# Patient Record
Sex: Female | Born: 1985 | Race: White | Hispanic: No | Marital: Married | Smoking: Never smoker
Health system: Southern US, Community
[De-identification: ages and names within clinical notes are randomized; demographics above are authoritative.]

## PROBLEM LIST (undated history)

## (undated) ENCOUNTER — Inpatient Hospital Stay (HOSPITAL_COMMUNITY): Payer: Self-pay

## (undated) DIAGNOSIS — B999 Unspecified infectious disease: Secondary | ICD-10-CM

## (undated) HISTORY — PX: RHINOPLASTY: SUR1284

## (undated) SURGERY — Surgical Case
Anesthesia: Spinal

---

## 2013-12-21 ENCOUNTER — Observation Stay: Payer: Self-pay | Admitting: Obstetrics and Gynecology

## 2013-12-22 LAB — URINALYSIS, COMPLETE
Bacteria: NONE SEEN
Bilirubin,UR: NEGATIVE
GLUCOSE, UR: NEGATIVE mg/dL (ref 0–75)
Ketone: NEGATIVE
Nitrite: NEGATIVE
Ph: 7 (ref 4.5–8.0)
Protein: NEGATIVE
Specific Gravity: 1.003 (ref 1.003–1.030)
Squamous Epithelial: <1
WBC UR: 1 /HPF (ref 0–5)

## 2013-12-22 LAB — WET PREP, GENITAL

## 2014-03-04 ENCOUNTER — Inpatient Hospital Stay: Payer: Self-pay

## 2014-03-04 LAB — CBC WITH DIFFERENTIAL/PLATELET
Basophil #: 0.1 10*3/uL (ref 0.0–0.1)
Basophil %: 0.5 %
Eosinophil #: 0.1 10*3/uL (ref 0.0–0.7)
Eosinophil %: 0.8 %
HCT: 36.4 % (ref 35.0–47.0)
HGB: 11.3 g/dL — ABNORMAL LOW (ref 12.0–16.0)
Lymphocyte #: 1.6 10*3/uL (ref 1.0–3.6)
Lymphocyte %: 14.8 %
MCH: 24.7 pg — AB (ref 26.0–34.0)
MCHC: 30.9 g/dL — ABNORMAL LOW (ref 32.0–36.0)
MCV: 80 fL (ref 80–100)
MONOS PCT: 7.4 %
Monocyte #: 0.8 x10 3/mm (ref 0.2–0.9)
Neutrophil #: 8.5 10*3/uL — ABNORMAL HIGH (ref 1.4–6.5)
Neutrophil %: 76.5 %
Platelet: 324 10*3/uL (ref 150–440)
RBC: 4.56 10*6/uL (ref 3.80–5.20)
RDW: 14.2 % (ref 11.5–14.5)
WBC: 11.1 10*3/uL — ABNORMAL HIGH (ref 3.6–11.0)

## 2014-03-06 DIAGNOSIS — O4290 Premature rupture of membranes, unspecified as to length of time between rupture and onset of labor, unspecified weeks of gestation: Secondary | ICD-10-CM

## 2014-03-06 LAB — CBC WITH DIFFERENTIAL/PLATELET
BASOS ABS: 0.1 10*3/uL (ref 0.0–0.1)
BASOS PCT: 0.5 %
Eosinophil #: 0.1 10*3/uL (ref 0.0–0.7)
Eosinophil %: 0.6 %
HCT: 37.2 % (ref 35.0–47.0)
HGB: 11.8 g/dL — ABNORMAL LOW (ref 12.0–16.0)
LYMPHS ABS: 1.6 10*3/uL (ref 1.0–3.6)
LYMPHS PCT: 14 %
MCH: 25.3 pg — AB (ref 26.0–34.0)
MCHC: 31.7 g/dL — ABNORMAL LOW (ref 32.0–36.0)
MCV: 80 fL (ref 80–100)
Monocyte #: 0.9 x10 3/mm (ref 0.2–0.9)
Monocyte %: 7.9 %
NEUTROS PCT: 77 %
Neutrophil #: 8.9 10*3/uL — ABNORMAL HIGH (ref 1.4–6.5)
PLATELETS: 315 10*3/uL (ref 150–440)
RBC: 4.65 10*6/uL (ref 3.80–5.20)
RDW: 14.6 % — ABNORMAL HIGH (ref 11.5–14.5)
WBC: 11.6 10*3/uL — ABNORMAL HIGH (ref 3.6–11.0)

## 2014-03-07 LAB — HEMATOCRIT: HCT: 27.8 % — AB (ref 35.0–47.0)

## 2014-03-09 LAB — CBC WITH DIFFERENTIAL/PLATELET
BASOS ABS: 0 10*3/uL (ref 0.0–0.1)
BASOS PCT: 0.4 %
EOS ABS: 0.3 10*3/uL (ref 0.0–0.7)
EOS PCT: 3.1 %
HCT: 27.9 % — ABNORMAL LOW (ref 35.0–47.0)
HGB: 8.6 g/dL — ABNORMAL LOW (ref 12.0–16.0)
LYMPHS PCT: 15.8 %
Lymphocyte #: 1.6 10*3/uL (ref 1.0–3.6)
MCH: 24.9 pg — ABNORMAL LOW (ref 26.0–34.0)
MCHC: 30.6 g/dL — AB (ref 32.0–36.0)
MCV: 81 fL (ref 80–100)
MONO ABS: 0.7 x10 3/mm (ref 0.2–0.9)
MONOS PCT: 6.6 %
NEUTROS ABS: 7.6 10*3/uL — AB (ref 1.4–6.5)
Neutrophil %: 74.1 %
Platelet: 294 10*3/uL (ref 150–440)
RBC: 3.44 10*6/uL — AB (ref 3.80–5.20)
RDW: 14.9 % — ABNORMAL HIGH (ref 11.5–14.5)
WBC: 10.3 10*3/uL (ref 3.6–11.0)

## 2014-11-10 NOTE — Op Note (Signed)
PATIENT NAME:  [name] Mcintyre, [name] MR#:  [id number] DATE OF BIRTH:  08-Dec-1985  DATE OF PROCEDURE:  03/06/2014  PREOPERATIVE DIAGNOSES:  1. Premature rupture of mebranes at 37 weeks 3 days gestation 2. Fetal intolerance to labor.   POSTOPERATIVE DIAGNOSES:  1. Premature rupture of membranes at 37 weeks 3 days gestation 2. Fetal intolerance to labor.   PROCEDURE PERFORMED: Primary low transverse cesarean section via Pfannenstiel skin incision.   ANESTHESIA USED: Epidural.   PRIMARY SURGEON: Florina Oundreas M. Bonney AidStaebler, M.D.   ESTIMATED BLOOD LOSS: 700 mL.   IV FLUIDS: 1 liter of crystalloid.   URINE OUTPUT: 700 mL.  PREOPERATIVE ANTIBIOTICS: 2 grams Ancef.   DRAINS OR TUBES:  Foley to gravity drainage, On-Q catheter system.   IMPLANTS: None.   THE PATIENT CONDITION FOLLOWING PROCEDURE: Stable.   COMPLICATIONS: None.   SPECIMENS REMOVED: None.   INTRAOPERATIVE FINDINGS: Normal tubes, ovaries, and uterus. Fetus in the OA position. Delivery resulted in the birth of a liveborn female infant weighing 3,060 grams, 6 pounds 12 ounces, Apgars 8 and 9.   PROCEDURE IN DETAIL: Risks, benefits, and alternatives of the procedure were discussed with the patient prior proceeding to the operating room. The patient was taken to the operating room where she was placed under general endotracheal anesthesia. She was positioned in the supine position, prepped and draped in the usual sterile fashion. Timeout was performed. The level of anesthetic was checked and noted to be adequate prior to proceeding with the procedure. A Pfannenstiel skin incision was made using the scalpel 2 cm above the pubic symphysis, carried down sharply to the level of the rectus fascia. The fascia was incised in the midline using the scalpel. The fascial incision was then extended using Mayo scissors. The superior border of the rectus fascia was grasped with two Kocher clamps and the rectus muscles were dissected off the fascia bluntly and  the median raphe incised using Mayo scissors. The inferior border of the rectus fascia was dissected off the rectus muscles in a similar fashion. The midline was identified. The peritoneum was entered bluntly and the peritoneal incision extended using manual traction. A bladder blade was placed, displacing the bladder caudally. A low transverse incision was then scored on the uterus using the scalpel. The uterus was entered bluntly using the operator's finger. The hysterotomy incision was extended using manual traction. Amniotomy was performed using an Allis clamp noting clear fluid. The operator's finger was placed in the hysterotomy incision noting the infant to be in the OA position. The vertex was grasped, flexed, brought to the incision, delivered atraumatically using fundal pressure. The remainder of the body delivered with ease. The infant was suctioned, cord was clamped and cut and the infant was passed to the awaiting pediatrician. Following delivery of the infant, the placenta was delivered using manual extraction. The uterus was exteriorized and wiped clean of clots and debris using two moist laps. The uterine incision was then closed using a 2 layer closure of 0 Vicryl with the first being a running locked, the second a vertical imbricating. The uterus was returned to the abdomen, perineum gutters were wiped clean of clots and debris. The hysterotomy incision was reinspected and noted to be hemostatic. The peritoneum was then closed using 2-0 Vicryl in a running fashion. The rectus muscles were reapproximated using a single 2-0 Vicryl mattress stitch. The On-Q catheters were placed subfascially per the usual protocol. Fascia was closed using a looped #1 PDS in a running fashion. Subcutaneous tissue  was irrigated. Hemostasis was achieved using the Bovie. The skin was closed using 4 Monocryl in a subcuticular fashion. The On-Q catheters were bolused with 5 mL of 0.5% bupivacaine each. Sponge, needle and  instrument counts were correct x 2. The patient tolerated the procedure well and was taken to the recovery room in stable condition.    ____________________________ Florina Ou. Bonney Aid, MD ams:JT D: 03/09/2014 23:29:40 ET T: 03/10/2014 03:56:53 ET JOB#: [id number]  cc: Florina Ou. Bonney Aid, MD, <Dictator> [place] Cathrine Muster MD ELECTRONICALLY SIGNED 03/12/2014 15:55

## 2014-11-27 NOTE — H&P (Signed)
L&D Evaluation:  History Expanded:  HPI 29 yo G1 with EDD of 03/18/14 per LMP and 7 wk US. Presents at 38 weeks with c/o large gush of clear fluid around 10 this am. She reports some contractions since then. [facility] to care, BMI >30 - early 1 hr 96. Received TDAP at 30 weeks.   Blood Type (Maternal) B positive   Group B Strep Results Maternal (Result >5wks must be treated as unknown) positive   Maternal HIV Negative   Maternal Syphilis Ab Nonreactive   Maternal Varicella Immune   Rubella Results (Maternal) immune   Maternal T-Dap Immune   Presents with leaking fluid   Patient's Medical History No Chronic Illness   Patient's Surgical History none   Medications Pre Natal Vitamins   Allergies NKDA   Social History none   Family History Non-Contributory   Exam:  Vital Signs stable   General no apparent distress   Mental Status clear   Chest clear   Heart no murmur/gallop/rubs   Abdomen gravid, tender with contractions   Estimated Fetal Weight Average for gestational age, 7 lb   Pelvic no external lesions, RN unable to reach cervix   Mebranes Ruptured   Description clear   FHT normal rate with no decels, category 1 tracing   Ucx regular, q 3-5 min   Impression:  Impression SROM, early labor   Plan:  Plan EFM/NST, monitor contractions and for cervical change, antibiotics for GBBS prophylaxis   Comments Ambulate if desired. Will re-exam after antibiotics in x 4 hours.  Pitocin augmentation prn.   Electronic Signatures: Vella KohlerBrothers, Kaliegh Willadsen K (CNM)  (Signed 16-Aug-15 14:46)  Authored: L&D Evaluation   Last Updated: 16-Aug-15 14:46 by Vella KohlerBrothers, Adolf Ormiston K (CNM)

## 2015-02-04 ENCOUNTER — Inpatient Hospital Stay (HOSPITAL_COMMUNITY)
Admission: AD | Admit: 2015-02-04 | Discharge: 2015-02-04 | Disposition: A | Discharge status: Home / Self Care | Payer: 59 | Source: Ambulatory Visit | Attending: Obstetrics & Gynecology | Admitting: Obstetrics & Gynecology

## 2015-02-04 ENCOUNTER — Inpatient Hospital Stay (HOSPITAL_COMMUNITY): Payer: 59

## 2015-02-04 ENCOUNTER — Encounter (HOSPITAL_COMMUNITY): Payer: Self-pay | Admitting: *Deleted

## 2015-02-04 DIAGNOSIS — O200 Threatened abortion: Secondary | ICD-10-CM | POA: Insufficient documentation

## 2015-02-04 DIAGNOSIS — R109 Unspecified abdominal pain: Secondary | ICD-10-CM | POA: Diagnosis present

## 2015-02-04 DIAGNOSIS — O209 Hemorrhage in early pregnancy, unspecified: Secondary | ICD-10-CM

## 2015-02-04 DIAGNOSIS — Z3A11 11 weeks gestation of pregnancy: Secondary | ICD-10-CM | POA: Diagnosis not present

## 2015-02-04 HISTORY — DX: Unspecified infectious disease: B99.9

## 2015-02-04 LAB — CBC
HCT: 37.9 % (ref 36.0–46.0)
Hemoglobin: 12 g/dL (ref 12.0–15.0)
MCH: 26.1 pg (ref 26.0–34.0)
MCHC: 31.7 g/dL (ref 30.0–36.0)
MCV: 82.6 fL (ref 78.0–100.0)
Platelets: 346 10*3/uL (ref 150–400)
RBC: 4.59 MIL/uL (ref 3.87–5.11)
RDW: 14.4 % (ref 11.5–15.5)
WBC: 11.3 10*3/uL — ABNORMAL HIGH (ref 4.0–10.5)

## 2015-02-04 LAB — URINALYSIS, ROUTINE W REFLEX MICROSCOPIC
Bilirubin Urine: NEGATIVE
Glucose, UA: NEGATIVE mg/dL
KETONES UR: NEGATIVE mg/dL
NITRITE: NEGATIVE
Protein, ur: 100 mg/dL — AB
Urobilinogen, UA: 0.2 mg/dL (ref 0.0–1.0)
pH: 7 (ref 5.0–8.0)

## 2015-02-04 LAB — URINE MICROSCOPIC-ADD ON

## 2015-02-04 LAB — HCG, QUANTITATIVE, PREGNANCY: HCG, BETA CHAIN, QUANT, S: 6234 m[IU]/mL — AB (ref <5)

## 2015-02-04 LAB — POCT PREGNANCY, URINE: Preg Test, Ur: POSITIVE — AB

## 2015-02-04 LAB — HIV ANTIBODY (ROUTINE TESTING W REFLEX): HIV SCREEN 4TH GENERATION: NONREACTIVE

## 2015-02-04 LAB — ABO/RH: ABO/RH(D): B POS

## 2015-02-04 MED ORDER — IBUPROFEN 600 MG PO TABS: 600.0000 mg | ORAL_TABLET | Freq: Four times a day (QID) | ORAL | Status: DC | PRN

## 2015-02-04 MED ORDER — HYDROCODONE-ACETAMINOPHEN 5-325 MG PO TABS: 1.0000 | ORAL_TABLET | ORAL | Status: DC | PRN

## 2015-02-04 MED ORDER — IBUPROFEN 600 MG PO TABS
600.0000 mg | ORAL_TABLET | Freq: Once | ORAL | Status: AC
  Administered 2015-02-04: 600 mg via ORAL
  Filled 2015-02-04: qty 1

## 2015-02-04 MED ORDER — HYDROMORPHONE HCL 1 MG/ML IJ SOLN
1.0000 mg | Freq: Once | INTRAMUSCULAR | Status: AC
  Administered 2015-02-04: 1 mg via INTRAMUSCULAR
  Filled 2015-02-04: qty 1

## 2015-02-04 NOTE — MAU Note (Signed)
Has not been seen/had preg confirmed.  Pain and bleeding started on Friday, was not as severe as now. Now bleeding as heavy

## 2015-02-04 NOTE — MAU Note (Signed)
Urine in lab 

## 2015-02-04 NOTE — Discharge Instructions (Signed)

## 2015-02-04 NOTE — MAU Provider Note (Signed)
History     CSN: [id number]  Arrival date and time: 02/04/15 4098   First Provider Initiated Contact with Patient 02/04/15 [phone]      Chief Complaint  Patient presents with  . Abdominal Pain  . Vaginal Bleeding  . Possible Pregnancy   HPI    [name] is a 29 y.o. female G2P1001 at [redacted]w[redacted]d presenting to MAU with abdominal pain and vaginal bleeding. Symptoms started on Friday and have progressively gotten worse throughout the weekend.  Vaginal bleeding: described as heavy like a period; bright red. She is having to wear a pad and states that she feels like she is on her period Abdominal pain: Bilateral lower abdomen; the pain radtiates to her lower back. She took tylenol at 0500 which did not help.  She has not started prenatal care.   OB History    Gravida Para Term Preterm AB TAB SAB Ectopic Multiple Living   0 0 0 0 0 0 1      Past Medical History  Diagnosis Date  . Infection     UTI    Past Surgical History  Procedure Laterality Date  . Cesarean section      Family History  Problem Relation Age of Onset  . Hypertension Maternal Grandmother   . Diabetes Paternal Grandmother     History  Substance Use Topics  . Smoking status: Never Smoker   . Smokeless tobacco: Never Used  . Alcohol Use: No    Allergies: No Known Allergies  Prescriptions prior to admission  Medication Sig Dispense Refill Last Dose  . acetaminophen (TYLENOL) 325 MG tablet Take 325 mg by mouth every 6 (six) hours as needed.   02/04/2015 at Unknown time  . Prenatal Vit-Fe Fumarate-FA (PRENATAL MULTIVITAMIN) TABS tablet Take 1 tablet by mouth daily at 12 noon.   02/04/2015 at Unknown time   Results for orders placed or performed during the hospital encounter of 02/04/15 (from the past 48 hour(s))  Urinalysis, Routine w reflex microscopic (not at [facility])     Status: Abnormal   Collection Time: 02/04/15  7:45 AM  Result Value Ref Range   Color, Urine RED (A) YELLOW    Comment:  BIOCHEMICALS MAY BE AFFECTED BY COLOR   APPearance CLEAR CLEAR   Specific Gravity, Urine <1.005 (L) 1.005 - 1.030   pH 7.0 5.0 - 8.0   Glucose, UA NEGATIVE NEGATIVE mg/dL   Hgb urine dipstick LARGE (A) NEGATIVE   Bilirubin Urine NEGATIVE NEGATIVE   Ketones, ur NEGATIVE NEGATIVE mg/dL   Protein, ur 478 (A) NEGATIVE mg/dL   Urobilinogen, UA 0.2 0.0 - 1.0 mg/dL   Nitrite NEGATIVE NEGATIVE   Leukocytes, UA TRACE (A) NEGATIVE  Urine microscopic-add on     Status: Abnormal   Collection Time: 02/04/15  7:45 AM  Result Value Ref Range   Squamous Epithelial / LPF RARE RARE   WBC, UA 3-6 <3 WBC/hpf   RBC / HPF TOO NUMEROUS TO COUNT <3 RBC/hpf   Bacteria, UA MANY (A) RARE  Pregnancy, urine POC     Status: Abnormal   Collection Time: 02/04/15  8:01 AM  Result Value Ref Range   Preg Test, Ur POSITIVE (A) NEGATIVE    Comment:        THE SENSITIVITY OF THIS METHODOLOGY IS >24 mIU/mL   hCG, quantitative, pregnancy     Status: Abnormal   Collection Time: 02/04/15  8:30 AM  Result Value Ref Range   hCG, Beta  Chain, Quant, S 6234 (H) <5 mIU/mL    Comment:          GEST. AGE      CONC.  (mIU/mL)   <=1 WEEK        5 - 50     2 WEEKS       50 - 500     3 WEEKS       100 - 10,000     4 WEEKS     1,000 - 30,000     5 WEEKS     3,500 - 115,000   6-8 WEEKS     12,000 - 270,000    12 WEEKS     15,000 - 220,000        FEMALE AND NON-PREGNANT FEMALE:     LESS THAN 5 mIU/mL   ABO/Rh     Status: None (Preliminary result)   Collection Time: 02/04/15  8:34 AM  Result Value Ref Range   ABO/RH(D) B POS   CBC     Status: Abnormal   Collection Time: 02/04/15  8:35 AM  Result Value Ref Range   WBC 11.3 (H) 4.0 - 10.5 K/uL   RBC 4.59 3.87 - 5.11 MIL/uL   Hemoglobin 12.0 12.0 - 15.0 g/dL   HCT 40.937.9 81.136.0 - 91.446.0 %   MCV 82.6 78.0 - 100.0 fL   MCH 26.1 26.0 - 34.0 pg   MCHC 31.7 30.0 - 36.0 g/dL   RDW 78.214.4 95.611.5 - 21.315.5 %   Platelets 346 150 - 400 K/uL    [place] Ob Comp Less 14 Wks  02/04/2015   CLINICAL  DATA:  Initial encounter for vaginal bleeding since Friday with positive pregnancy test.  EXAM: OBSTETRIC <14 WK US AND TRANSVAGINAL OB US  TECHNIQUE: Both transabdominal and transvaginal ultrasound examinations were performed for complete evaluation of the gestation as well as the maternal uterus, adnexal regions, and pelvic cul-de-sac. Transvaginal technique was performed to assess early pregnancy.  COMPARISON:  None.  FINDINGS: Intrauterine gestational sac: They single, abnormally elongated gestational sac is identified in the lower uterine segment.  Yolk sac:  Not visualized  Embryo:  Not visualized  MSD: 21.2  mm   7 w   1  d  Maternal uterus/adnexae: Moderate subchorionic hemorrhage is evident. Maternal left ovary not well seen, but no evidence for left adnexal mass. The maternal right ovary has normal sonographic imaging features. No evidence for free fluid in the cul-de-sac.  IMPRESSION: Elongated, irregular gestational sac identified in the lower uterine segment with no evidence for yolk sac or embryo. Overall imaging features are highly suspicious for non-viability. Follow-up serial beta HCG and ultrasound could be used to assess further.   Electronically Signed   By: Kennith CenterEric  Mansell M.D.   On: 02/04/2015 09:28   [place] Ob Transvaginal  02/04/2015   CLINICAL DATA:  Initial encounter for vaginal bleeding since Friday with positive pregnancy test.  EXAM: OBSTETRIC <14 WK US AND TRANSVAGINAL OB US  TECHNIQUE: Both transabdominal and transvaginal ultrasound examinations were performed for complete evaluation of the gestation as well as the maternal uterus, adnexal regions, and pelvic cul-de-sac. Transvaginal technique was performed to assess early pregnancy.  COMPARISON:  None.  FINDINGS: Intrauterine gestational sac: They single, abnormally elongated gestational sac is identified in the lower uterine segment.  Yolk sac:  Not visualized  Embryo:  Not visualized  MSD: 21.2  mm   7 w   1  d  Maternal  uterus/adnexae: Moderate  subchorionic hemorrhage is evident. Maternal left ovary not well seen, but no evidence for left adnexal mass. The maternal right ovary has normal sonographic imaging features. No evidence for free fluid in the cul-de-sac.  IMPRESSION: Elongated, irregular gestational sac identified in the lower uterine segment with no evidence for yolk sac or embryo. Overall imaging features are highly suspicious for non-viability. Follow-up serial beta HCG and ultrasound could be used to assess further.   Electronically Signed   By: Kennith Center M.D.   On: 02/04/2015 09:28    Review of Systems  Constitutional: Negative for fever.  Gastrointestinal: Negative for nausea and vomiting.  Genitourinary: Negative for dysuria, urgency, frequency and hematuria.   Physical Exam   Blood pressure 115/79, pulse 116, temperature 98.4 F (36.9 C), temperature source Oral, resp. rate 20, last menstrual period 11/16/2014.  Physical Exam  Constitutional: She is oriented to person, place, and time. She appears well-developed and well-nourished. No distress.  Respiratory: Effort normal.  GI: Soft. There is no tenderness.  Genitourinary:  Cervix closed, posterior   Neurological: She is alert and oriented to person, place, and time.  Skin: Skin is warm. She is not diaphoretic.  Psychiatric: Her behavior is normal.    MAU Course  Procedures  None  MDM  B positive blood type   Discussed that this is likely an SAB in progress; Sac in the lower uterine segment. Will treat with pain medication and follow up in 48 hours.   Assessment and Plan   A:  1. Threatened miscarriage   2. Vaginal bleeding in pregnancy, first trimester     P:  Discharge home in stable condition Return to MAU in 48 hours for repeat beta hcg RX: Vicodin, ibuprofen  Bleeding precautions Return to MAU if symptoms worsen Support given.     Duane Lope, NP 02/04/2015 12:10 PM

## 2015-02-06 ENCOUNTER — Inpatient Hospital Stay (HOSPITAL_COMMUNITY)
Admission: AD | Admit: 2015-02-06 | Discharge: 2015-02-06 | Disposition: A | Discharge status: Home / Self Care | Payer: 59 | Source: Ambulatory Visit | Attending: Obstetrics and Gynecology | Admitting: Obstetrics and Gynecology

## 2015-02-06 DIAGNOSIS — Z3A11 11 weeks gestation of pregnancy: Secondary | ICD-10-CM | POA: Diagnosis not present

## 2015-02-06 DIAGNOSIS — O039 Complete or unspecified spontaneous abortion without complication: Secondary | ICD-10-CM | POA: Diagnosis not present

## 2015-02-06 DIAGNOSIS — O209 Hemorrhage in early pregnancy, unspecified: Secondary | ICD-10-CM | POA: Diagnosis present

## 2015-02-06 LAB — HCG, QUANTITATIVE, PREGNANCY: hCG, Beta Chain, Quant, S: 1056 m[IU]/mL — ABNORMAL HIGH (ref <5)

## 2015-02-06 NOTE — MAU Note (Signed)
Still having vaginal bleeding like a normal period, denies pain at present.

## 2015-02-06 NOTE — MAU Provider Note (Signed)
Subjective:  Ms. Jacquelyne BalintJennifer Wexler is a 29 y.o. G2P1001 at 4729w6d presenting to MAU for a follow up beta hcg level. She was seen 2 days ago in MAU for heavy bleeding in early pregnancy.    The Vaginal bleeding 2 days ago was prescribed as heavy like a period; bright red. She was having to wear a pad and states that she feels like she was on her menstrual cycle. Currently, today her pain is very minimal; her bleeding is very light. She feels much better.    Objective:  GENERAL: Well-developed, well-nourished female in no acute distress.  LUNGS: Effort normal SKIN: Warm, dry and without erythema ABDOMEN: soft, non tender PSYCH: Normal mood and affect  Filed Vitals:   02/06/15 1239  BP: 103/71  Pulse: 85  Temp: 97.7 F (36.5 C)  Resp: 16   Results for orders placed or performed during the hospital encounter of 02/06/15 (from the past 48 hour(s))  hCG, quantitative, pregnancy     Status: Abnormal   Collection Time: 02/06/15 12:44 PM  Result Value Ref Range   hCG, Beta Chain, Quant, S 1056 (H) <5 mIU/mL    Comment:          GEST. AGE      CONC.  (mIU/mL)   <=1 WEEK        5 - 50     2 WEEKS       50 - 500     3 WEEKS       100 - 10,000     4 WEEKS     1,000 - 30,000     5 WEEKS     3,500 - 115,000   6-8 WEEKS     12,000 - 270,000    12 WEEKS     15,000 - 220,000        FEMALE AND NON-PREGNANT FEMALE:     LESS THAN 5 mIU/mL    MDM:  Beta quant 7/18: 6234 Beta quant: 7/20: 1056   Assessment:  1. SAB (spontaneous abortion)      Plan:  Discharge home in stable condition Beta hcg levels dropped from 6234 to 1056 in 48 hours.  Follow up in the [facility] in 1-2 weeks; the W[facility] will call to schedule appointment Return to MAU with returning symptoms: heavy bleeding or worsening abdominal pain Bleeding precautions Pelvic rest Support given  Duane LopeJennifer I Alessa Mazur, NP 02/07/2015 1:47 PM     Duane LopeJennifer I Yordy Matton, NP 02/07/2015 1:40 PM

## 2015-02-06 NOTE — Discharge Instructions (Signed)
Miscarriage °A miscarriage is the loss of an unborn baby (fetus) before the 20th week of pregnancy. The cause is often unknown.  °HOME CARE °· You may need to stay in bed (bed rest), or you may be able to do light activity. Go about activity as told by your doctor. °· Have help at home. °· Write down how many pads you use each day. Write down how soaked they are. °· Do not use tampons. Do not wash out your vagina (douche) or have sex (intercourse) until your doctor approves. °· Only take medicine as told by your doctor. °· Do not take aspirin. °· Keep all doctor visits as told. °· If you or your partner have problems with grieving, talk to your doctor. You can also try counseling. Give yourself time to grieve before trying to get pregnant again. °GET HELP RIGHT AWAY IF: °· You have bad cramps or pain in your back or belly (abdomen). °· You have a fever. °· You pass large clumps of blood (clots) from your vagina that are walnut-sized or larger. Save the clumps for your doctor to see. °· You pass large amounts of tissue from your vagina. Save the tissue for your doctor to see. °· You have more bleeding. °· You have thick, bad-smelling fluid (discharge) coming from the vagina. °· You get lightheaded, weak, or you pass out (faint). °· You have chills. °MAKE SURE YOU: °· Understand these instructions. °· Will watch your condition. °· Will get help right away if you are not doing well or get worse. °Document Released: 09/28/2011 Document Reviewed: 09/28/2011 °[employer]® Patient Information ©2015 [employer]. This information is not intended to replace advice given to you by your health care provider. Make sure you discuss any questions you have with your health care provider. ° °

## 2015-02-20 ENCOUNTER — Other Ambulatory Visit: Payer: 59

## 2015-02-20 DIAGNOSIS — O4690 Antepartum hemorrhage, unspecified, unspecified trimester: Secondary | ICD-10-CM

## 2015-02-21 LAB — HCG, QUANTITATIVE, PREGNANCY: hCG, Beta Chain, Quant, S: 25.3 m[IU]/mL

## 2015-02-26 ENCOUNTER — Telehealth: Payer: Self-pay | Admitting: *Deleted

## 2015-02-26 NOTE — Telephone Encounter (Addendum)
Dr. Debroah Loop states pt's drop in Bhcg is consistent with complete miscarriage. Level is 25.3  Attempted to contact patient, no answer, left message for patient to contact the clinic.  8/10  1155  Called pt and left message that I am calling with important test result information. Please call back and leave message stating whether we may leave detailed information on her voice mail.  Diane Day RNC 8/10  1430  Pt left message stating that she can be reached @ [phone]. I called her and informed her of hormone level results which are consistent with a complete miscarriage. She will need another test done within a few days to be sure that the level has returned to the non pregnant state. Pt voiced understanding and agreed to come for BHCG level on 8/15 @ 0930.

## 2015-03-04 ENCOUNTER — Other Ambulatory Visit: Payer: 59

## 2015-03-04 DIAGNOSIS — O209 Hemorrhage in early pregnancy, unspecified: Secondary | ICD-10-CM

## 2015-03-05 ENCOUNTER — Telehealth: Payer: Self-pay

## 2015-03-05 LAB — HCG, QUANTITATIVE, PREGNANCY: hCG, Beta Chain, Quant, S: 2.6 m[IU]/mL

## 2015-03-05 NOTE — Telephone Encounter (Signed)
Per Dr. Macon Large, pt has complete miscarriage no further labs need to be done; schedule pt for contraception appt in 2-3 wks.

## 2015-03-05 NOTE — Telephone Encounter (Signed)
Pt returned call and I informed her she does not need any other labs that she has complete miscarriage.   I asked pt if she was interested in [facility] she stated that she would need to speak with her husband and she will call us back if she decides to get [facility].  I advised pt to wait about 6 weeks before trying to become pregnant so that her body will get back to normal.  Pt stated understanding with no further questions.

## 2015-03-28 ENCOUNTER — Ambulatory Visit: Payer: 59 | Admitting: Obstetrics & Gynecology

## 2015-12-10 ENCOUNTER — Encounter (HOSPITAL_COMMUNITY): Payer: Self-pay | Admitting: *Deleted

## 2016-12-18 ENCOUNTER — Ambulatory Visit (INDEPENDENT_AMBULATORY_CARE_PROVIDER_SITE_OTHER): Payer: Medicaid Other | Admitting: Obstetrics & Gynecology

## 2016-12-18 ENCOUNTER — Encounter: Payer: Self-pay | Admitting: Obstetrics & Gynecology

## 2016-12-18 VITALS — BP 120/80 | Ht 59.0 in | Wt 169.0 lb

## 2016-12-18 DIAGNOSIS — Z3A11 11 weeks gestation of pregnancy: Secondary | ICD-10-CM

## 2016-12-18 DIAGNOSIS — O0990 Supervision of high risk pregnancy, unspecified, unspecified trimester: Secondary | ICD-10-CM

## 2016-12-18 DIAGNOSIS — Z98891 History of uterine scar from previous surgery: Secondary | ICD-10-CM

## 2016-12-18 LAB — POCT URINE PREGNANCY: Preg Test, Ur: POSITIVE — AB

## 2016-12-18 NOTE — Progress Notes (Signed)
12/18/2016   Chief Complaint: Missed period  Transfer of Care Patient: no  History of Present Illness: [name] Mcintyre is a 31 y.o. G3P1001 4030w2d based on Patient's last menstrual period was 09/30/2016. with an Estimated Date of Delivery: 07/07/17, with the above CC.   Her periods were: regular periods every 28 days She was using no method when she conceived.  She has Positive signs or symptoms of nausea/vomiting of pregnancy. She has Negative signs or symptoms of miscarriage or preterm labor She identifies Negative Zika risk factors for her and her partner On any different medications around the time she conceived/early pregnancy: No  History of varicella: Yes   ROS: A 12-point review of systems was performed and negative, except as stated in the above HPI.  OBGYN History: As per HPI. OB History  Gravida Para Term Preterm AB Living  3 1 1  0 0 1  SAB TAB Ectopic Multiple Live Births  0 0 0 0      # Outcome Date GA Lbr Len/2nd Weight Sex Delivery Anes PTL Lv  3 Current           2 Gravida           1 Term               Any issues with any prior pregnancies: CS Any prior children are healthy, doing well, without any problems or issues: no History of pap smears: No. Last pap smear 2016. Abnormal: no  History of STIs: No   Past Medical History: Past Medical History:  Diagnosis Date  . Infection    UTI    Past Surgical History: Past Surgical History:  Procedure Laterality Date  . CESAREAN SECTION      Family History:  Family History  Problem Relation Age of Onset  . Hypertension Maternal Grandmother   . Diabetes Paternal Grandmother    She denies any female cancers, bleeding or blood clotting disorders.  She denies any history of mental retardation, birth defects or genetic disorders in her or the FOB's history  Social History:  Social History   Social History  . Marital status: Married    Spouse name: N/A  . Number of children: N/A  . Years of education: N/A     Occupational History  . Not on file.   Social History Main Topics  . Smoking status: Never Smoker  . Smokeless tobacco: Never Used  . Alcohol use No  . Drug use: No  . Sexual activity: Not on file   Other Topics Concern  . Not on file   Social History Narrative  . No narrative on file   Any pets in the household: no  Allergy: No Known Allergies  Current Outpatient Medications: .  Prenatal Vit-Fe Fumarate-FA (PRENATAL MULTIVITAMIN) TABS tablet, Take 1 tablet by mouth daily at 12 noon., Disp: , Rfl:   Physical Exam:   BP 120/80   Ht 4\' 11"  (1.499 m)   Wt 169 lb (76.7 kg)   LMP 09/30/2016   BMI 34.13 kg/m  Body mass index is 34.13 kg/m. Constitutional: Well nourished, well developed female in no acute distress.  Neck:  Supple, normal appearance, and no thyromegaly  Cardiovascular: S1, S2 normal, no murmur, rub or gallop, regular rate and rhythm Respiratory:  Clear to auscultation bilateral. Normal respiratory effort Abdomen: positive bowel sounds and no masses, hernias; diffusely non tender to palpation, non distended Breasts: breasts appear normal, no suspicious masses, no skin or nipple changes or axillary  nodes. Neuro/Psych:  Normal mood and affect.  Skin:  Warm and dry.  Lymphatic:  No inguinal lymphadenopathy.   Pelvic exam: is not limited by body habitus EGBUS: within normal limits, Vagina: within normal limits and with no blood in the vault, Cervix: normal appearing cervix without discharge or lesions, closed/long/high, Uterus:  enlarged: 10 weeks size, and Adnexa:  not evaluated  Assessment: [name] Mcintyre is a 31 y.o. G3P1001 [redacted]w[redacted]d based on Patient's last menstrual period was 09/30/2016. with an Estimated Date of Delivery: 07/07/17,  for prenatal care.  Plan:  1) Avoid alcoholic beverages. 2) Patient encouraged not to smoke.  3) Discontinue the use of all non-medicinal drugs and chemicals.  4) Take prenatal vitamins daily.  5) Seatbelt use advised 6)  Nutrition, food safety (fish, cheese advisories, and high nitrite foods) and exercise discussed. 7) Hospital and practice style delivering at [facility] discussed  8) Patient is asked about travel to areas at risk for the Zika virus, and counseled to avoid travel and exposure to mosquitoes or sexual partners who may have themselves been exposed to the virus. Testing is discussed, and will be ordered as appropriate.  9) Childbirth classes at [facility] advised 10) Genetic Screening, such as with 1st Trimester Screening, cell free fetal DNA, AFP testing, and Ultrasound, as well as with amniocentesis and CVS as appropriate, is discussed with patient. She plans to have not genetic testing this pregnancy. 11) CS history discussed.  VBAC options.  Problem list reviewed and updated.  Annamarie Major, MD, [facility], [facility] 12/18/2016  9:38 AM

## 2016-12-18 NOTE — Patient Instructions (Signed)

## 2016-12-18 NOTE — Addendum Note (Signed)
Addended by: Cornelius MorasPATTERSON, Mckynna Vanloan D on: 12/18/2016 10:31 AM   Modules accepted: Orders

## 2016-12-20 LAB — URINE CULTURE: Organism ID, Bacteria: NO GROWTH

## 2016-12-20 LAB — RPR+RH+ABO+RUB AB+AB SCR+CB...
ANTIBODY SCREEN: NEGATIVE
HEMATOCRIT: 36.3 % (ref 34.0–46.6)
HEMOGLOBIN: 11.8 g/dL (ref 11.1–15.9)
HIV Screen 4th Generation wRfx: NONREACTIVE
Hepatitis B Surface Ag: NEGATIVE
MCH: 26.8 pg (ref 26.6–33.0)
MCHC: 32.5 g/dL (ref 31.5–35.7)
MCV: 83 fL (ref 79–97)
Platelets: 339 10*3/uL (ref 150–379)
RBC: 4.4 x10E6/uL (ref 3.77–5.28)
RDW: 14 % (ref 12.3–15.4)
RH TYPE: POSITIVE
RPR: NONREACTIVE
Rubella Antibodies, IGG: 1.51 index (ref >0.99)
Varicella zoster IgG: 462 index (ref >165)
WBC: 8.7 10*3/uL (ref 3.4–10.8)

## 2016-12-21 IMAGING — US US OB TRANSVAGINAL
1 series · 13 of 28 positions shown · non-contrast
Comparison: None.

CLINICAL DATA: Initial encounter for vaginal bleeding since [REDACTED]
with positive pregnancy test.

EXAM:
OBSTETRIC <14 WK US AND TRANSVAGINAL OB US
TECHNIQUE: Both transabdominal and transvaginal ultrasound examinations were
performed for complete evaluation of the gestation as well as the
maternal uterus, adnexal regions, and pelvic cul-de-sac.
Transvaginal technique was performed to assess early pregnancy.

[Series 1: us ob comp less 14 wk · 13 of 44 slices shown]
[im 2/44]
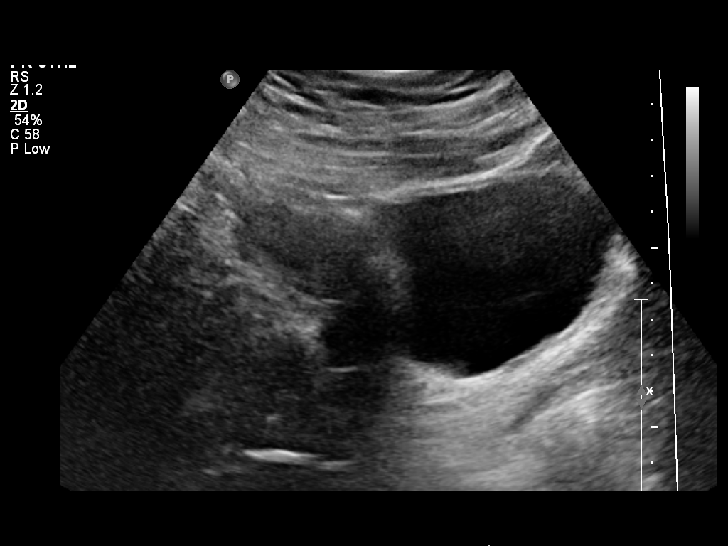
[im 5/44]
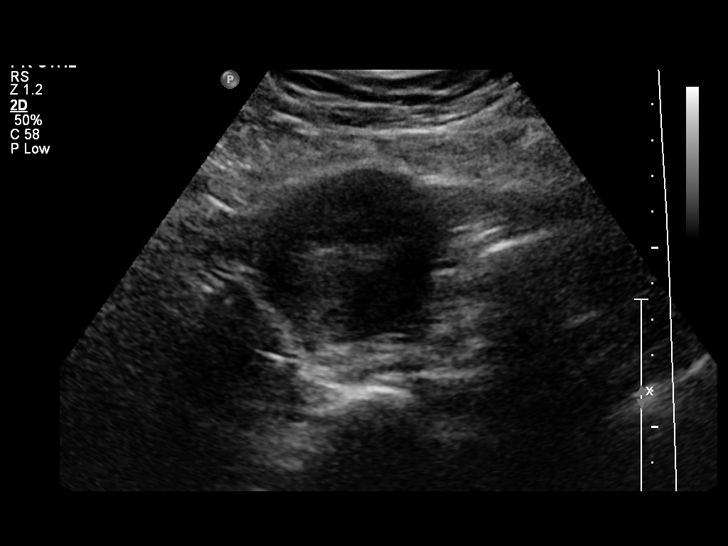
[im 8/44]
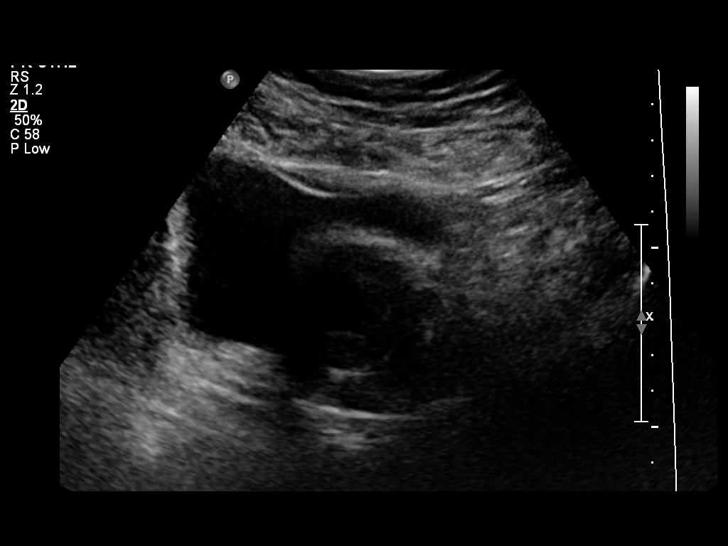
[im 12/44]
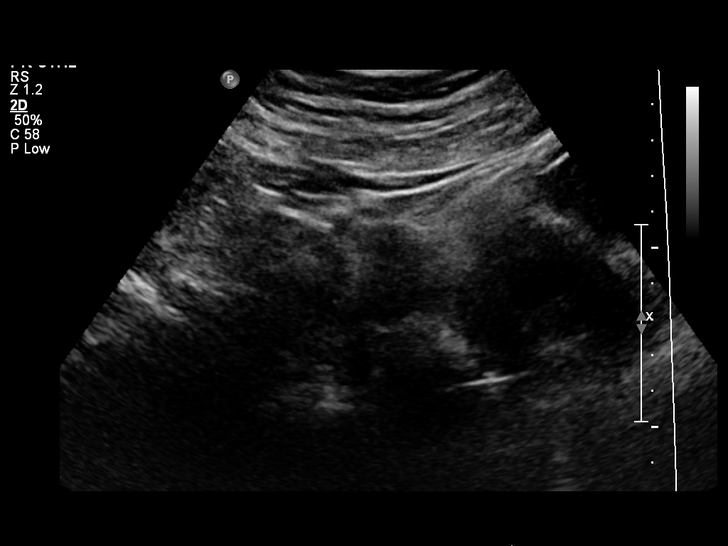
[im 15/44]
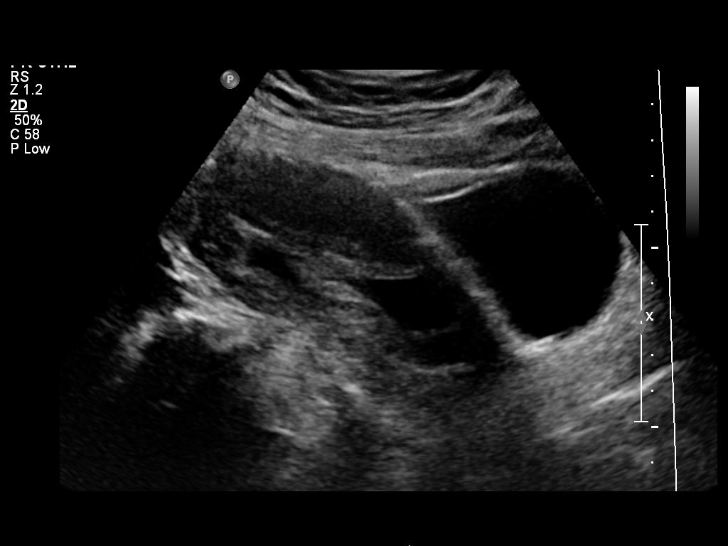
[im 18/44]
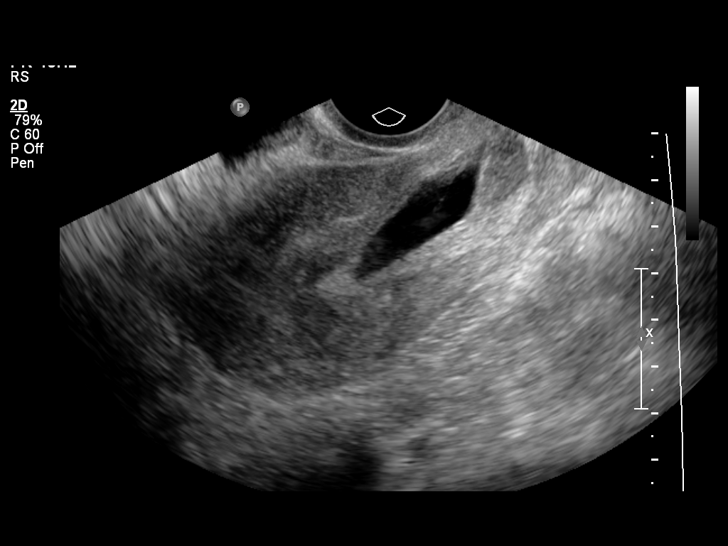
[im 23/44]
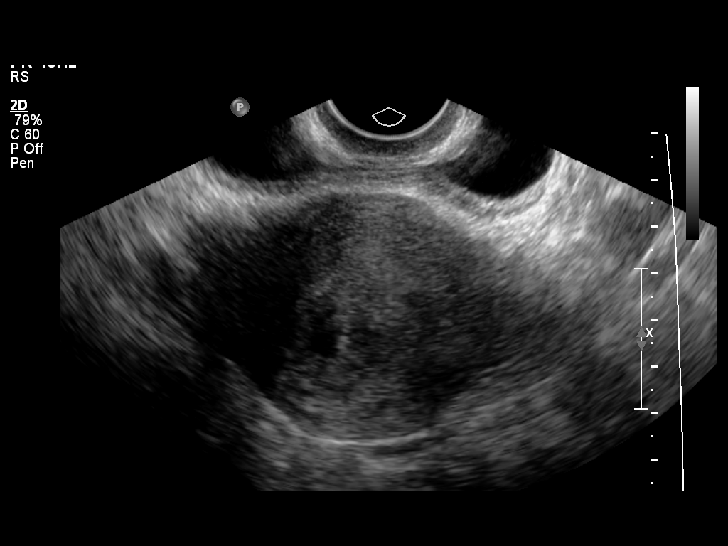
[im 26/44]
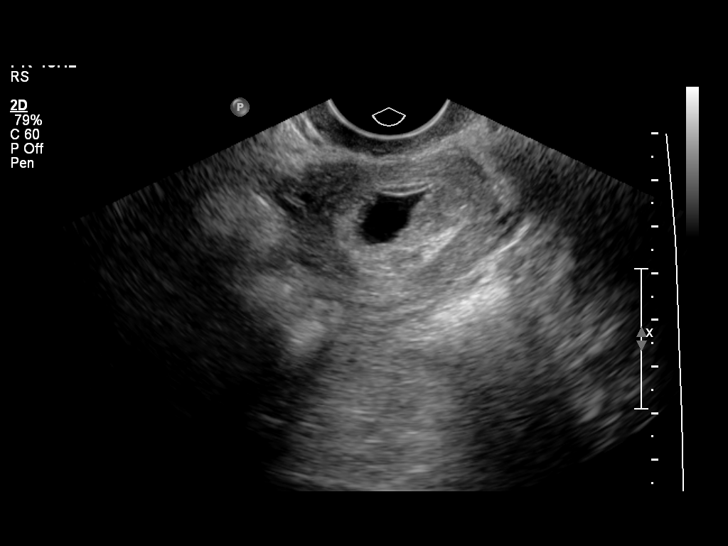
[im 29/44]
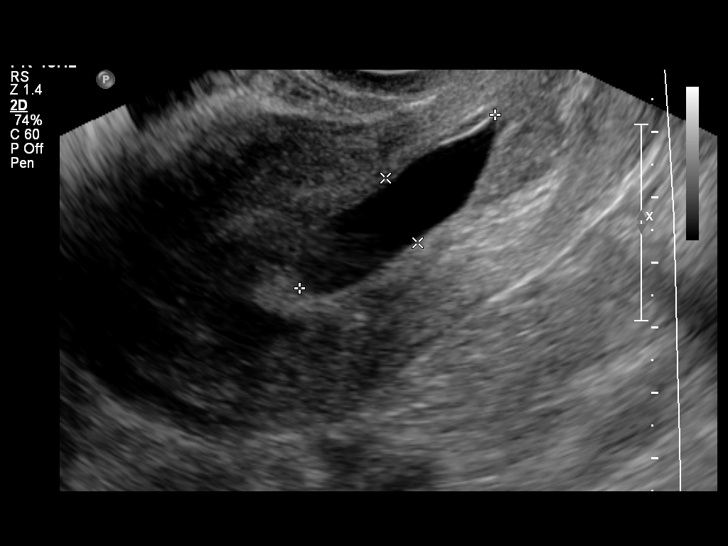
[im 32/44]
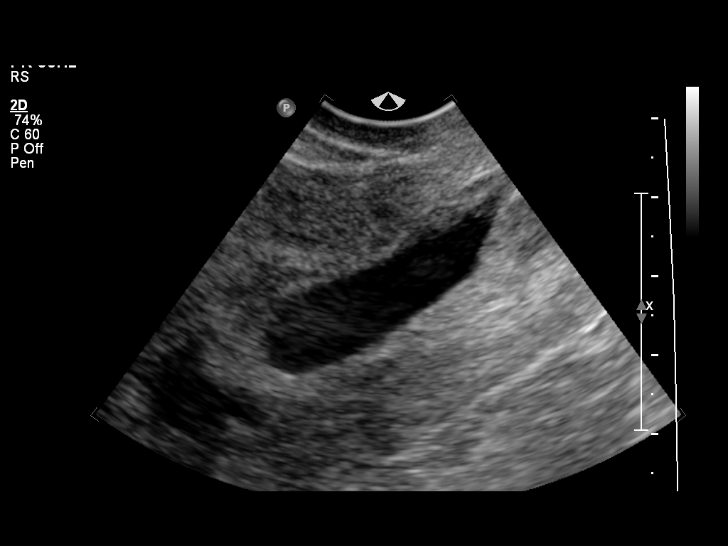
[im 36/44]
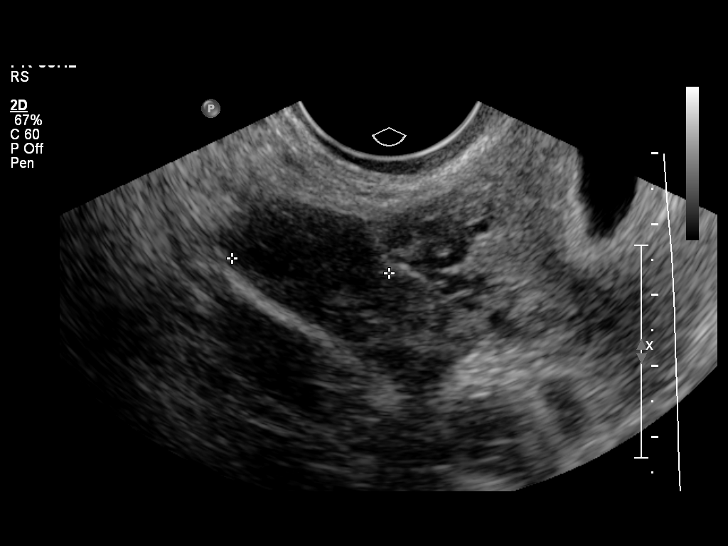
[im 39/44]
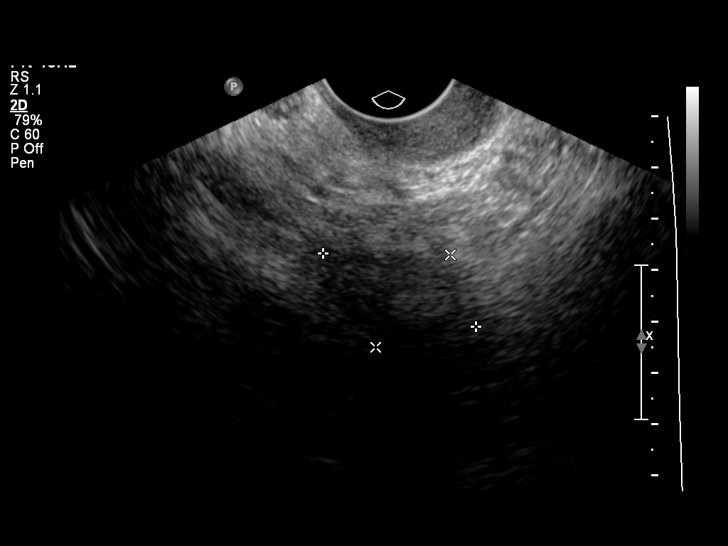
[im 42/44]
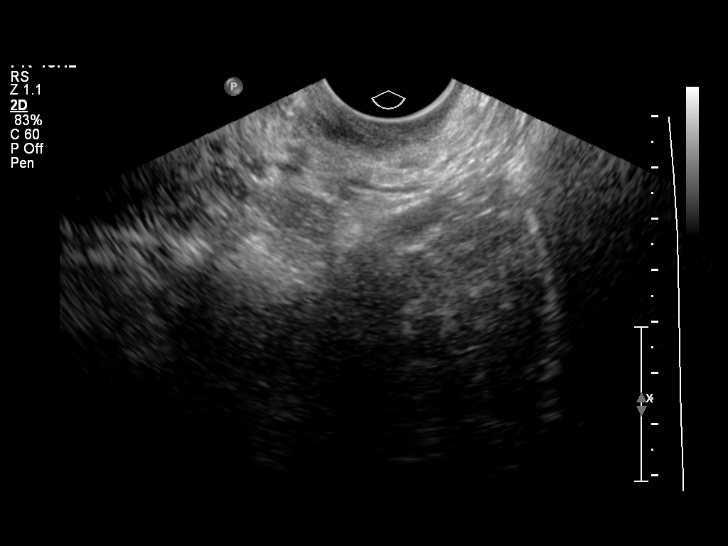

[13 of 28 positions shown; findings below may reference images not displayed]

FINDINGS: Intrauterine gestational sac: They single, abnormally elongated
gestational sac is identified in the lower uterine segment.

Yolk sac:  Not visualized

Embryo:  Not visualized

MSD: 21.2  mm   7 w   1  d

Maternal uterus/adnexae: Moderate subchorionic hemorrhage is
evident. Maternal left ovary not well seen, but no evidence for left
adnexal mass. The maternal right ovary has normal sonographic
imaging features. No evidence for free fluid in the cul-de-sac.
IMPRESSION: Elongated, irregular gestational sac identified in the lower uterine
segment with no evidence for yolk sac or embryo. Overall imaging
features are highly suspicious for non-viability. Follow-up serial
beta HCG and ultrasound could be used to assess further.

## 2016-12-23 LAB — IGP,CTNG,APTIMAHPV
Chlamydia, Nuc. Acid Amp: NEGATIVE
GONOCOCCUS BY NUCLEIC ACID AMP: NEGATIVE
HPV APTIMA: NEGATIVE
PAP Smear Comment: 0

## 2017-01-04 ENCOUNTER — Ambulatory Visit (INDEPENDENT_AMBULATORY_CARE_PROVIDER_SITE_OTHER): Payer: Medicaid Other

## 2017-01-04 ENCOUNTER — Ambulatory Visit (INDEPENDENT_AMBULATORY_CARE_PROVIDER_SITE_OTHER): Payer: Medicaid Other | Admitting: Obstetrics and Gynecology

## 2017-01-04 VITALS — BP 102/62 | Wt 170.0 lb

## 2017-01-04 DIAGNOSIS — Z3A11 11 weeks gestation of pregnancy: Secondary | ICD-10-CM

## 2017-01-04 DIAGNOSIS — Z362 Encounter for other antenatal screening follow-up: Secondary | ICD-10-CM | POA: Diagnosis not present

## 2017-01-04 DIAGNOSIS — Z3A13 13 weeks gestation of pregnancy: Secondary | ICD-10-CM

## 2017-01-04 NOTE — Progress Notes (Signed)
Pos PNVs, NVP. No VB, LOF. Episodic BLQ pains, question round ligament vs constipation. Sx improve after BMs. No ovar cyst on u/s. Dating scan S=D. Declines 1st tri scrn.

## 2017-01-04 NOTE — Progress Notes (Signed)
F/u dating u/s today. Pt c/o pain in lower abdomen but denies pressure or spotting.

## 2017-02-01 ENCOUNTER — Encounter: Payer: Self-pay | Admitting: Obstetrics and Gynecology

## 2017-02-01 ENCOUNTER — Ambulatory Visit (INDEPENDENT_AMBULATORY_CARE_PROVIDER_SITE_OTHER): Payer: Medicaid Other | Admitting: Obstetrics and Gynecology

## 2017-02-01 VITALS — BP 106/75 | Wt 175.0 lb

## 2017-02-01 DIAGNOSIS — Z3A17 17 weeks gestation of pregnancy: Secondary | ICD-10-CM

## 2017-02-01 DIAGNOSIS — O34219 Maternal care for unspecified type scar from previous cesarean delivery: Secondary | ICD-10-CM | POA: Insufficient documentation

## 2017-02-01 DIAGNOSIS — O0990 Supervision of high risk pregnancy, unspecified, unspecified trimester: Secondary | ICD-10-CM

## 2017-02-01 NOTE — Progress Notes (Signed)
Prenatal Visit Note Date: 02/01/2017 Clinic: [facility]  Subjective:  Jacquelyne BalintJennifer Joffe is a 31 y.o. G3P1011 at 6175w5d being seen today for ongoing prenatal care.  She is currently monitored for the following issues for this high-risk pregnancy and has High risk pregnancy, antepartum and History of cesarean delivery affecting pregnancy on her problem list.  Patient reports no bleeding, no leaking and difficulty breathing while sleeping due to long history of nasal issues (S/p septal surgery).    .  .   . Denies leaking of fluid.   The following portions of the patient's history were reviewed and updated as appropriate: allergies, current medications, past family history, past medical history, past social history, past surgical history and problem list. Problem list updated.  Objective:   Vitals:   02/01/17 0938  BP: 106/75  Weight: 175 lb (79.4 kg)    Fetal Status:           General:  Alert, oriented and cooperative. Patient is in no acute distress.  Skin: Skin is warm and dry. No rash noted.   Cardiovascular: Normal heart rate noted  Respiratory: Normal respiratory effort, no problems with respiration noted  Abdomen: Soft, gravid, appropriate for gestational age.       Pelvic:  Cervical exam deferred        Extremities: Normal range of motion.     Mental Status: Normal mood and affect. Normal behavior. Normal judgment and thought content.   Urinalysis: Urine Protein: Negative Urine Glucose: Negative  Assessment and Plan:  Pregnancy: G3P1011 at 2275w5d  1. History of cesarean delivery affecting pregnancy Consider offering later in pregnancy  2. High risk pregnancy, antepartum - declines msAFP - US OB Comp + 14 Wk; Future  3. [redacted] weeks gestation of pregnancy - US OB Comp + 2614 Wk; Future  Preterm labor symptoms and general obstetric precautions including but not limited to vaginal bleeding, contractions, leaking of fluid and fetal movement were reviewed in detail with the  patient. Please refer to After Visit Summary for other counseling recommendations.  Return in about 3 weeks (around 02/22/2017) for schedule anatomy u/s and ROB.   Thomasene MohairStephen Jackson, MD 02/01/2017 10:25 AM

## 2017-02-23 ENCOUNTER — Other Ambulatory Visit: Payer: Medicaid Other

## 2017-02-23 ENCOUNTER — Encounter: Payer: Medicaid Other | Admitting: Advanced Practice Midwife

## 2017-02-24 ENCOUNTER — Encounter: Payer: Medicaid Other | Admitting: Advanced Practice Midwife

## 2017-02-24 ENCOUNTER — Other Ambulatory Visit: Payer: Medicaid Other

## 2017-02-25 ENCOUNTER — Telehealth: Payer: Self-pay | Admitting: Obstetrics & Gynecology

## 2017-02-25 ENCOUNTER — Other Ambulatory Visit: Payer: Self-pay | Admitting: Obstetrics & Gynecology

## 2017-02-25 DIAGNOSIS — Z363 Encounter for antenatal screening for malformations: Secondary | ICD-10-CM

## 2017-02-25 NOTE — Telephone Encounter (Signed)
LVM for patient to call back to r/s due to EPIC Outage 02/25/17. Anatomy scan and rob

## 2017-02-26 NOTE — Telephone Encounter (Signed)
The patient was seen briefly on 8/8 during the EPIC outage. Date of visit: 02/24/2017  S: She has been feeling some round ligament pain  O: BP116/74, weight 176 FHTs 150 Fundal Height: 21.5 cm  A: 31 y.o. Female G3P1011 with current pregnancy at 21 weeks, positive fetal heart tones, round ligament pain  P: Patient will be contacted to reschedule anatomy scan that was scheduled for today.  Tresea MallJane Tajai Ihde, CNM  02/26/2017

## 2017-03-01 ENCOUNTER — Ambulatory Visit (INDEPENDENT_AMBULATORY_CARE_PROVIDER_SITE_OTHER): Payer: Medicaid Other | Admitting: Obstetrics & Gynecology

## 2017-03-01 ENCOUNTER — Ambulatory Visit (INDEPENDENT_AMBULATORY_CARE_PROVIDER_SITE_OTHER): Payer: Medicaid Other

## 2017-03-01 VITALS — BP 110/60 | Wt 176.0 lb

## 2017-03-01 DIAGNOSIS — O0990 Supervision of high risk pregnancy, unspecified, unspecified trimester: Secondary | ICD-10-CM

## 2017-03-01 DIAGNOSIS — Z3A21 21 weeks gestation of pregnancy: Secondary | ICD-10-CM

## 2017-03-01 DIAGNOSIS — Z363 Encounter for antenatal screening for malformations: Secondary | ICD-10-CM

## 2017-03-01 DIAGNOSIS — O34219 Maternal care for unspecified type scar from previous cesarean delivery: Secondary | ICD-10-CM

## 2017-03-01 NOTE — Progress Notes (Signed)
Review of ULTRASOUND.    I have personally reviewed images and report of recent [facility].    Plan of management to be discussed with patient. PNV.

## 2017-03-01 NOTE — Patient Instructions (Signed)

## 2017-03-15 ENCOUNTER — Telehealth: Payer: Self-pay | Admitting: Obstetrics and Gynecology

## 2017-03-15 NOTE — Telephone Encounter (Signed)
done

## 2017-03-15 NOTE — Telephone Encounter (Signed)
Dr. Marice Potter office is requesting a medical clearnace for patient to be able to have her Dental care. Faxed # [phone] cb# [phone]

## 2017-03-29 ENCOUNTER — Ambulatory Visit (INDEPENDENT_AMBULATORY_CARE_PROVIDER_SITE_OTHER): Payer: Medicaid Other | Admitting: Obstetrics and Gynecology

## 2017-03-29 VITALS — BP 122/60 | Wt 182.0 lb

## 2017-03-29 DIAGNOSIS — O34219 Maternal care for unspecified type scar from previous cesarean delivery: Secondary | ICD-10-CM

## 2017-03-29 DIAGNOSIS — Z131 Encounter for screening for diabetes mellitus: Secondary | ICD-10-CM

## 2017-03-29 DIAGNOSIS — O0990 Supervision of high risk pregnancy, unspecified, unspecified trimester: Secondary | ICD-10-CM

## 2017-03-29 DIAGNOSIS — Z3A25 25 weeks gestation of pregnancy: Secondary | ICD-10-CM

## 2017-03-29 DIAGNOSIS — Z113 Encounter for screening for infections with a predominantly sexual mode of transmission: Secondary | ICD-10-CM

## 2017-03-29 NOTE — Progress Notes (Signed)
  Routine Prenatal Care Visit  Subjective  [name] Mcintyre is a 31 y.o. G3P1011 at 7456w5d being seen today for ongoing prenatal care.  She is currently monitored for the following issues for this high-risk pregnancy and has High risk pregnancy, antepartum and History of cesarean delivery affecting pregnancy on her problem list.  ----------------------------------------------------------------------------------- Patient reports no complaints.   Contractions: Not present. Vag. Bleeding: None.  Movement: Present. Denies leaking of fluid.  Discussed TOLAC. Discussed risks of uterine rupture. Consent form provided and encouraged her to ask questions. ----------------------------------------------------------------------------------- The following portions of the patient's history were reviewed and updated as appropriate: allergies, current medications, past family history, past medical history, past social history, past surgical history and problem list. Problem list updated.   Objective  Blood pressure 122/60, weight 182 lb (82.6 kg), last menstrual period 09/30/2016, unknown if currently breastfeeding. Pregravid weight 152 lb (68.9 kg) Total Weight Gain 30 lb (13.6 kg) Urinalysis:      Fetal Status: Fetal Heart Rate (bpm): 150 Fundal Height: 26 cm Movement: Present     General:  Alert, oriented and cooperative. Patient is in no acute distress.  Skin: Skin is warm and dry. No rash noted.   Cardiovascular: Normal heart rate noted  Respiratory: Normal respiratory effort, no problems with respiration noted  Abdomen: Soft, gravid, appropriate for gestational age. Pain/Pressure: Absent     Pelvic:  Cervical exam deferred        Extremities: Normal range of motion.     Mental Status: Normal mood and affect. Normal behavior. Normal judgment and thought content.   Assessment   31 y.o. G3P1011 at 256w5d by  07/07/2017, by Last Menstrual Period presenting for routine prenatal visit  Plan    pregnancy3 Problems (from 09/30/16 to present)    Problem Noted Resolved   History of cesarean delivery affecting pregnancy 02/01/2017 by Conard NovakJackson, Nolawi Kanady D, MD No    Desires TOLAC  Preterm labor symptoms and general obstetric precautions including but not limited to vaginal bleeding, contractions, leaking of fluid and fetal movement were reviewed in detail with the patient. Please refer to After Visit Summary for other counseling recommendations.   Return in about 3 weeks (around 04/19/2017) for schedule 1h gtt/28 week labs and routine prenatal.  Thomasene MohairStephen Renarda Mullinix, MD  03/29/2017 2:58 PM

## 2017-04-19 ENCOUNTER — Other Ambulatory Visit: Payer: Medicaid Other

## 2017-04-19 ENCOUNTER — Encounter: Payer: Self-pay | Admitting: Advanced Practice Midwife

## 2017-04-19 ENCOUNTER — Other Ambulatory Visit: Payer: Medicaid Other | Admitting: Advanced Practice Midwife

## 2017-04-19 ENCOUNTER — Ambulatory Visit (INDEPENDENT_AMBULATORY_CARE_PROVIDER_SITE_OTHER): Payer: Medicaid Other | Admitting: Advanced Practice Midwife

## 2017-04-19 VITALS — BP 110/60 | Wt 183.0 lb

## 2017-04-19 DIAGNOSIS — Z3A28 28 weeks gestation of pregnancy: Secondary | ICD-10-CM

## 2017-04-19 DIAGNOSIS — Z23 Encounter for immunization: Secondary | ICD-10-CM | POA: Insufficient documentation

## 2017-04-19 NOTE — Progress Notes (Signed)
  Routine Prenatal Care Visit  Subjective  [name] Mcintyre is a 31 y.o. G3P1011 at [redacted]w[redacted]d being seen today for ongoing prenatal care.  She is currently monitored for the following issues for this high-risk pregnancy and has High risk pregnancy, antepartum and History of cesarean delivery affecting pregnancy on her problem list.  ----------------------------------------------------------------------------------- Patient reports no complaints.   Denies leaking of fluid, denies vaginal bleeding, denies contractions. Admits positive fetal movement. ----------------------------------------------------------------------------------- The following portions of the patient's history were reviewed and updated as appropriate: allergies, current medications, past family history, past medical history, past social history, past surgical history and problem list. Problem list updated.   Objective  Blood pressure 110/60, weight 183 lb (83 kg), last menstrual period 09/30/2016 Pregravid weight 152 lb (68.9 kg) Total Weight Gain 31 lb (14.1 kg) Urinalysis: Urine Protein: Negative Urine Glucose: Negative  Fetal Status:  Heart tones in 130's/140's         General:  Alert, oriented and cooperative. Patient is in no acute distress.  Skin: Skin is warm and dry. No rash noted.   Cardiovascular: Normal heart rate noted  Respiratory: Normal respiratory effort, no problems with respiration noted  Abdomen: Soft, gravid, appropriate for gestational age.       Pelvic:  Cervical exam deferred        Extremities: Normal range of motion.     Mental Status: Normal mood and affect. Normal behavior. Normal judgment and thought content.   Assessment   31 y.o. G3P1011 at [redacted]w[redacted]d by  07/07/2017, by Last Menstrual Period presenting for routine prenatal visit  Plan   pregnancy3 Problems (from 09/30/16 to present)    Problem Noted Resolved   History of cesarean delivery affecting pregnancy 02/01/2017 by Conard Novak, MD  No       Preterm labor symptoms and general obstetric precautions including but not limited to vaginal bleeding, contractions, leaking of fluid and fetal movement were reviewed in detail with the patient. Please refer to After Visit Summary for other counseling recommendations.   She plans a VBAC delivery and has been counseled as to the risks and benefits during this pregnancy. Patient plans to breastfeed. She plans condoms for birth control and is aware that is not the most effective method.   Return in about 2 weeks (around 05/03/2017) for rob.  Tresea Mall, CNM  04/19/2017 9:00 AM

## 2017-04-19 NOTE — Patient Instructions (Signed)
Vaginal Birth After Cesarean Delivery Vaginal birth after cesarean delivery (VBAC) is giving birth vaginally after previously delivering a baby by a cesarean. In the past, if a woman had a cesarean delivery, all births afterward would be done by cesarean delivery. This is no longer true. It can be safe for the mother to try a vaginal delivery after having a cesarean delivery. It is important to discuss VBAC with your health care provider early in the pregnancy so you can understand the risks, benefits, and options. It will give you time to decide what is best in your particular case. The final decision about whether to have a VBAC or repeat cesarean delivery should be between you and your health care provider. Any changes in your health or your baby's health during your pregnancy may make it necessary to change your initial decision about VBAC. Women who plan to have a VBAC should check with their health care provider to be sure that:  The previous cesarean delivery was done with a low transverse uterine cut (incision) (not a vertical classical incision).  The birth canal is big enough for the baby.  There were no other operations on the uterus.  An electronic fetal monitor (EFM) will be on at all times during labor.  An operating room will be available and ready in case an emergency cesarean delivery is needed.  A health care provider and surgical nursing staff will be available at all times during labor to be ready to do an emergency delivery cesarean if necessary.  An anesthesiologist will be present in case an emergency cesarean delivery is needed.  The nursery is prepared and has adequate personnel and necessary equipment available to care for the baby in case of an emergency cesarean delivery. Benefits of VBAC  Shorter stay in the hospital.  Avoidance of risks associated with cesarean delivery, such as: ? Surgical complications, such as opening of the incision or hernia in the  incision. ? Injury to other organs. ? Fever. This can occur if an infection develops after surgery. It can also occur as a reaction to the medicine given to make you numb during the surgery.  Less blood loss and need for blood transfusions.  Lower risk of blood clots and infection.  Shorter recovery.  Decreased risk for having to remove the uterus (hysterectomy).  Decreased risk for the placenta to completely or partially cover the opening of the uterus (placenta previa) with a future pregnancy.  Decrease risk in future labor and delivery. Risks of a VBAC  Tearing (rupture) of the uterus. This is occurs in less than 1% of VBACs. The risk of this happening is higher if: ? Steps are taken to begin the labor process (induce labor) or stimulate or strengthen contractions (augment labor). ? Medicine is used to soften (ripen) the cervix.  Having to remove the uterus (hysterectomy) if it ruptures. VBAC should not be done if:  The previous cesarean delivery was done with a vertical (classical) or T-shaped incision or you do not know what kind of incision was made.  You had a ruptured uterus.  You have had certain types of surgery on your uterus, such as removal of uterine fibroids. Ask your health care provider about other types of surgeries that prevent you from having a VBAC.  You have certain medical or childbirth (obstetrical) problems.  There are problems with the baby.  You have had two previous cesarean deliveries and no vaginal deliveries. Other facts to know about VBAC:  It   is safe to have an epidural anesthetic with VBAC.  It is safe to turn the baby from a breech position (attempt an external cephalic version).  It is safe to try a VBAC with twins.  VBAC may not be successful if your baby weights 8.8 lb (4 kg) or more. However, weight predictions are not always accurate and should not be used alone to decide if VBAC is right for you.  There is an increased failure rate  if the time between the cesarean delivery and VBAC is less than 19 months.  Your health care provider may advise against a VBAC if you have preeclampsia (high blood pressure, protein in the urine, and swelling of face and extremities).  VBAC is often successful if you previously gave birth vaginally.  VBAC is often successful when the labor starts spontaneously before the due date.  Delivering a baby through a VBAC is similar to having a normal spontaneous vaginal delivery. This information is not intended to replace advice given to you by your health care provider. Make sure you discuss any questions you have with your health care provider. Document Released: 12/27/2006 Document Revised: 12/12/2015 Document Reviewed: 02/02/2013 Elsevier Interactive Patient Education  2018 Elsevier Inc.  

## 2017-04-20 LAB — 28 WEEK RH+PANEL
Basophils Absolute: 0 10*3/uL (ref 0.0–0.2)
Basos: 0 %
EOS (ABSOLUTE): 0.2 10*3/uL (ref 0.0–0.4)
EOS: 2 %
Gestational Diabetes Screen: 132 mg/dL (ref 65–139)
HEMOGLOBIN: 9.9 g/dL — AB (ref 11.1–15.9)
HIV Screen 4th Generation wRfx: NONREACTIVE
Hematocrit: 32.1 % — ABNORMAL LOW (ref 34.0–46.6)
IMMATURE GRANS (ABS): 0 10*3/uL (ref 0.0–0.1)
IMMATURE GRANULOCYTES: 0 %
Lymphocytes Absolute: 1.5 10*3/uL (ref 0.7–3.1)
Lymphs: 19 %
MCH: 25.4 pg — AB (ref 26.6–33.0)
MCHC: 30.8 g/dL — ABNORMAL LOW (ref 31.5–35.7)
MCV: 82 fL (ref 79–97)
MONOS ABS: 0.5 10*3/uL (ref 0.1–0.9)
Monocytes: 6 %
NEUTROS ABS: 5.8 10*3/uL (ref 1.4–7.0)
NEUTROS PCT: 73 %
PLATELETS: 310 10*3/uL (ref 150–379)
RBC: 3.9 x10E6/uL (ref 3.77–5.28)
RDW: 14.1 % (ref 12.3–15.4)
RPR: NONREACTIVE
WBC: 8 10*3/uL (ref 3.4–10.8)

## 2017-05-03 ENCOUNTER — Ambulatory Visit (INDEPENDENT_AMBULATORY_CARE_PROVIDER_SITE_OTHER): Payer: Medicaid Other | Admitting: Maternal Newborn

## 2017-05-03 VITALS — BP 100/60 | Wt 181.0 lb

## 2017-05-03 DIAGNOSIS — Z23 Encounter for immunization: Secondary | ICD-10-CM

## 2017-05-03 DIAGNOSIS — O0990 Supervision of high risk pregnancy, unspecified, unspecified trimester: Secondary | ICD-10-CM

## 2017-05-03 DIAGNOSIS — Z3A30 30 weeks gestation of pregnancy: Secondary | ICD-10-CM | POA: Diagnosis not present

## 2017-05-03 NOTE — Progress Notes (Signed)
    Routine Prenatal Care Visit  Subjective  [name] Mcintyre is a 31 y.o. G3P1011 at [redacted]w[redacted]d being seen today for ongoing prenatal care.  She is currently monitored for the following issues for this high-risk pregnancy and has High risk pregnancy, antepartum; History of cesarean delivery affecting pregnancy; and Need for immunization against influenza on her problem list.  ----------------------------------------------------------------------------------- Patient reports lower abdominal pressure and back aches, especially when standing for long periods at work. Contractions: Not present. Vag. Bleeding: None.  Movement: Present. Denies leaking of fluid.  ----------------------------------------------------------------------------------- The following portions of the patient's history were reviewed and updated as appropriate: allergies, current medications, past family history, past medical history, past social history, past surgical history and problem list. Problem list updated.   Objective  Blood pressure 100/60, weight 181 lb (82.1 kg), last menstrual period 09/30/2016, unknown if currently breastfeeding. Pregravid weight 152 lb (68.9 kg) Total Weight Gain 29 lb (13.2 kg) Urinalysis: Patient unable to void during visit. Fetal Status: Fetal Heart Rate (bpm): 138 Fundal Height: 30 cm Movement: Present     General:  Alert, oriented and cooperative. Patient is in no acute distress.  Skin: Skin is warm and dry. No rash noted.   Cardiovascular: Normal heart rate noted  Respiratory: Normal respiratory effort, no problems with respiration noted  Abdomen: Soft, gravid, appropriate for gestational age. Pain/Pressure: Present     Pelvic:  Cervical exam deferred        Extremities: Normal range of motion.     Mental Status: Normal mood and affect. Normal behavior. Normal judgment and thought content.     Assessment   31 y.o. [id number] at [redacted]w[redacted]d by  07/07/2017, by Last Menstrual Period presenting for  routineprenatal visit.  Plan   pregnancy3 Problems (from 09/30/16 to present)    Problem Noted Resolved   History of cesarean delivery affecting pregnancy 02/01/2017 by Conard Novak, MD No    Discussed frequent rest breaks and using a pregnancy support band or belt to help with symptoms of pressure and back ache.  TdaP vaccine today.  Preterm labor symptoms and general obstetric precautions including but not limited to vaginal bleeding, contractions, leaking of fluid and fetal movement were reviewed in detail with the patient.  Return in about 2 weeks (around 05/17/2017) for ROB.  Marcelyn Bruins, CNM 05/03/2017  11:03 AM

## 2017-05-20 ENCOUNTER — Telehealth: Payer: Self-pay | Admitting: Obstetrics and Gynecology

## 2017-05-20 ENCOUNTER — Encounter: Payer: Self-pay | Admitting: Certified Nurse Midwife

## 2017-05-20 ENCOUNTER — Ambulatory Visit (INDEPENDENT_AMBULATORY_CARE_PROVIDER_SITE_OTHER): Payer: Medicaid Other | Admitting: Certified Nurse Midwife

## 2017-05-20 VITALS — BP 104/60 | Wt 183.0 lb

## 2017-05-20 DIAGNOSIS — O34219 Maternal care for unspecified type scar from previous cesarean delivery: Secondary | ICD-10-CM

## 2017-05-20 DIAGNOSIS — Z3A33 33 weeks gestation of pregnancy: Secondary | ICD-10-CM

## 2017-05-20 DIAGNOSIS — O0990 Supervision of high risk pregnancy, unspecified, unspecified trimester: Secondary | ICD-10-CM

## 2017-05-20 NOTE — Telephone Encounter (Signed)
-----   Message from Farrel Connersolleen Gutierrez, [place] sent at 05/20/2017  1:16 PM EDT ----- Regarding: schedule repeat CS Surgery Booking Request Patient Full Name:  Jacquelyne BalintJennifer Rehfeld MRN: [id number]  DOB: 09-09-1985  Surgeon: Dr Bonney AidStaebler Requested Surgery Date and Time: 06 Jul 2017 Primary Diagnosis AND Code: Prior Cesarean section Secondary Diagnosis and Code:  Surgical Procedure: Repeat Cesarean Section L&D Notification: Yes Admission Status: surgery admit Length of Surgery:  Special Case Needs:  H&P: pending (date) Phone Interview: no Interpreter: no Language: English Medical Clearance: not needed Special Scheduling Instructions: no

## 2017-05-20 NOTE — Telephone Encounter (Signed)
Patient is aware [facility] Dr. Bonney AidStaebler, Pre-admit Testing appointment to be scheduled for 07/05/17 (9:00am requested), and OR on 07/06/17.

## 2017-05-20 NOTE — Progress Notes (Signed)
ROB Pt has some vaginal pressure,and nausea/vomitting. Also has episodes of feeling hot and dizzy.

## 2017-05-20 NOTE — Progress Notes (Signed)
ROB at 33wk1d. History of prior CS for FITL on day 2 of IOL for PROM at 38 weeks. Desires TOLAC. Discussed risks of uterine rupture with spontaneous labor at <1%. Increased risk of uterine rupture with IOL esp with unripe cervix.  Scheduled for repeat CS on 18 Dec with DR Bonney AidStaebler if not in labor or with unripe cervix. Desires to breast feed/ BC options discussed FH at 33cm today/ FHTs WNL. Baby has been active. Having PND which is causing her nausea. Will sometimes vomit, but usually only brings up mucous or bile. Can take Zyrtec or Claritin or Flonase for allergic rhinitis. RTO 2 weeks. Given [facility] instructions.

## 2017-06-02 ENCOUNTER — Ambulatory Visit (INDEPENDENT_AMBULATORY_CARE_PROVIDER_SITE_OTHER): Payer: Medicaid Other | Admitting: Certified Nurse Midwife

## 2017-06-02 VITALS — BP 110/58 | Wt 183.0 lb

## 2017-06-02 DIAGNOSIS — Z3A35 35 weeks gestation of pregnancy: Secondary | ICD-10-CM

## 2017-06-02 DIAGNOSIS — O34219 Maternal care for unspecified type scar from previous cesarean delivery: Secondary | ICD-10-CM

## 2017-06-02 DIAGNOSIS — O0990 Supervision of high risk pregnancy, unspecified, unspecified trimester: Secondary | ICD-10-CM

## 2017-06-02 NOTE — Progress Notes (Signed)
ROB at 35 weeks. Prior CS desires TOLAC. Repeat CS scheduled for 18 Dec if NIL Baby active. No vaginal bleeding FH 36cm and FHT 141 GBS NV ROB 1 week.  Farrel Connersolleen Luisdaniel Kenton, CNM

## 2017-06-02 NOTE — Progress Notes (Signed)
ROB Pt has some pressure around her belly button, and braxton hicks

## 2017-06-09 ENCOUNTER — Encounter: Payer: Self-pay | Admitting: Maternal Newborn

## 2017-06-09 ENCOUNTER — Ambulatory Visit (INDEPENDENT_AMBULATORY_CARE_PROVIDER_SITE_OTHER): Payer: Medicaid Other | Admitting: Maternal Newborn

## 2017-06-09 VITALS — BP 110/70 | Wt 185.0 lb

## 2017-06-09 DIAGNOSIS — Z3A36 36 weeks gestation of pregnancy: Secondary | ICD-10-CM

## 2017-06-09 DIAGNOSIS — O0990 Supervision of high risk pregnancy, unspecified, unspecified trimester: Secondary | ICD-10-CM

## 2017-06-09 NOTE — Progress Notes (Signed)
    Routine Prenatal Care Visit  Subjective  [name] Mcintyre is a 31 y.o. G3P1011 at 48107w0d being seen today for ongoing prenatal care.  She is currently monitored for the following issues for this high-risk pregnancy and has High risk pregnancy, antepartum; History of cesarean delivery affecting pregnancy; and Need for immunization against influenza on her problem list.  ----------------------------------------------------------------------------------- Patient reports pelvic pressure, some pain around umbilicus, increased discharge. No itching, burning, or odor.  Contractions: Not present. Vag. Bleeding: None.  Movement: Present. Denies leaking of fluid.  ----------------------------------------------------------------------------------- The following portions of the patient's history were reviewed and updated as appropriate: allergies, current medications, past family history, past medical history, past social history, past surgical history and problem list. Problem list updated.   Objective  Last menstrual period 09/30/2016. Pregravid weight 152 lb (68.9 kg) Total Weight Gain 33 lb (15 kg) Urinalysis: Urine Protein: Negative Urine Glucose: Negative  Fetal Status: Fetal Heart Rate (bpm): 140 Fundal Height: 37 cm Movement: Present     General:  Alert, oriented and cooperative. Patient is in no acute distress.  Skin: Skin is warm and dry. No rash noted.   Cardiovascular: Normal heart rate noted  Respiratory: Normal respiratory effort, no problems with respiration noted  Abdomen: Soft, gravid, appropriate for gestational age. Pain/Pressure: Present     Pelvic:  Cervical exam deferred        Extremities: Normal range of motion.  Edema: None  Mental Status: Normal mood and affect. Normal behavior. Normal judgment and thought content.   Normal physiologic discharge present.  Assessment   31 y.o. [id number] at 51107w0d, EDD 07/07/2017 by Last Menstrual Period, presenting for routine prenatal  visit.  Plan   pregnancy3 Problems (from 09/30/16 to present)    Problem Noted Resolved   History of cesarean delivery affecting pregnancy 02/01/2017 by Conard NovakJackson, Stephen D, MD No   High risk pregnancy, antepartum 12/18/2016 by Nadara MustardHarris, Robert P, MD No   Overview Addendum 05/20/2017  1:16 PM by Farrel ConnersGutierrez, Colleen, CNM    [facility] Prenatal Labs  Dating LMP Blood type: B/Positive/-- (06/01 1011)   Genetic Screen 1 Screen: declines     AFP: declines Antibody:Negative (06/01 1011)  Anatomic US  Rubella: 1.51 (06/01 1011) Varicella: I  GTT 28 weeks: 132 RPR: Non Reactive (06/01 1011)   Rhogam NA HBsAg: Negative (06/01 1011)   TDaP vaccine       05/03/2017                Flu Shot:04/19/17 HIV: Non Reactive (06/01 1011)   Baby Food    Breast                            GBS:   Contraception  Pap:NIL 12/18/2016  CBB     CS/VBAC Prior CS. Desires TOL, scheduled for repeat CS 12/18 if NIL   Support Person                GBS and GC collected today. Treatment plan discussed if GBS is positive.  Preterm labor symptoms and general obstetric precautions including but not limited to vaginal bleeding, contractions, leaking of fluid and fetal movement were reviewed in detail with the patient.  Return in about 1 week (around 06/16/2017) for ROB.  Marcelyn BruinsJacelyn Anzleigh Mcintyre, CNM 06/09/2017  12:24 PM

## 2017-06-09 NOTE — Progress Notes (Signed)
C/O Pain around belly button, pain pelvic area, white d/c. rj

## 2017-06-09 NOTE — Addendum Note (Signed)
Addended by: Marcelyn BruinsSCHMID, JACELYN on: 06/09/2017 01:42 PM   Modules accepted: Orders

## 2017-06-12 LAB — GC/CHLAMYDIA PROBE AMP
CHLAMYDIA, DNA PROBE: NEGATIVE
Neisseria gonorrhoeae by PCR: NEGATIVE

## 2017-06-13 LAB — SPECIMEN STATUS REPORT

## 2017-06-13 LAB — STREP GP B NAA: STREP GROUP B AG: POSITIVE — AB

## 2017-06-17 ENCOUNTER — Encounter: Payer: Self-pay | Admitting: Maternal Newborn

## 2017-06-17 ENCOUNTER — Ambulatory Visit (INDEPENDENT_AMBULATORY_CARE_PROVIDER_SITE_OTHER): Payer: Medicaid Other | Admitting: Maternal Newborn

## 2017-06-17 VITALS — BP 100/70 | Wt 187.0 lb

## 2017-06-17 DIAGNOSIS — O0990 Supervision of high risk pregnancy, unspecified, unspecified trimester: Secondary | ICD-10-CM

## 2017-06-17 DIAGNOSIS — R309 Painful micturition, unspecified: Secondary | ICD-10-CM | POA: Diagnosis not present

## 2017-06-17 DIAGNOSIS — Z3A37 37 weeks gestation of pregnancy: Secondary | ICD-10-CM

## 2017-06-17 LAB — POCT URINALYSIS DIPSTICK
BILIRUBIN UA: NEGATIVE
Blood, UA: NEGATIVE
Glucose, UA: NEGATIVE
Ketones, UA: NEGATIVE
Leukocytes, UA: NEGATIVE
Nitrite, UA: NEGATIVE
Protein, UA: NEGATIVE
Spec Grav, UA: 1.02 (ref 1.010–1.025)
Urobilinogen, UA: NEGATIVE E.U./dL — AB
pH, UA: 6 (ref 5.0–8.0)

## 2017-06-17 NOTE — Progress Notes (Addendum)
    Routine Prenatal Care Visit  Subjective  [name] BalintJennifer Shibley is a 31 y.o. G3P1011 at 5127w1d being seen today for ongoing prenatal care.  She is currently monitored for the following issues for this high-risk pregnancy and has High risk pregnancy, antepartum and History of cesarean delivery affecting pregnancy on their problem list.  ----------------------------------------------------------------------------------- Patient reports occasional contractions and pelvic pressure and some pain on urination..   Contractions: Irregular. Vag. Bleeding: None.  Movement: Present. Denies leaking of fluid.  ----------------------------------------------------------------------------------- The following portions of the patient's history were reviewed and updated as appropriate: allergies, current medications, past family history, past medical history, past social history, past surgical history and problem list. Problem list updated.   Objective  Blood pressure 100/70, weight 187 lb (84.8 kg), last menstrual period 09/30/2016, unknown if currently breastfeeding. Pregravid weight 152 lb (68.9 kg) Total Weight Gain 35 lb (15.9 kg) Urinalysis: Urine Protein: Negative Urine Glucose: Negative  Fetal Status: Fetal Heart Rate (bpm): 142 Fundal Height: 37 cm Movement: Present  Presentation: Vertex  General:  Alert, oriented and cooperative. Patient is in no acute distress.  Skin: Skin is warm and dry. No rash noted.   Cardiovascular: Normal heart rate noted  Respiratory: Normal respiratory effort, no problems with respiration noted  Abdomen: Soft, gravid, appropriate for gestational age. Pain/Pressure: Present     Pelvic:  Cervical exam performed Dilation: Closed Effacement (%): 20 Station: Ballotable  Extremities: Normal range of motion.  Edema: None  Mental Status: Normal mood and affect. Normal behavior. Normal judgment and thought content.   UA negative.  Assessment   31 y.o. [id number] at 6727w1d by   07/07/2017, by Last Menstrual Period presenting for routine prenatal visit  Plan   pregnancy3 Problems (from 09/30/16 to present)    Problem Noted Resolved   History of cesarean delivery affecting pregnancy 02/01/2017 by Conard NovakJackson, Stephen D, MD No   High risk pregnancy, antepartum 12/18/2016 by Nadara MustardHarris, Robert P, MD No   Overview Addendum 05/20/2017  1:16 PM by Farrel ConnersGutierrez, Colleen, CNM    [facility] Prenatal Labs  Dating LMP Blood type: B/Positive/-- (06/01 1011)   Genetic Screen 1 Screen: declines     AFP: declines Antibody:Negative (06/01 1011)  Anatomic US  Rubella: 1.51 (06/01 1011) Varicella: I  GTT 28 weeks: 132 RPR: Non Reactive (06/01 1011)   Rhogam NA HBsAg: Negative (06/01 1011)   TDaP vaccine       05/03/2017                Flu Shot:04/19/17 HIV: Non Reactive (06/01 1011)   Baby Food    Breast                            GBS:   Contraception  Pap:NIL 12/18/2016  CBB     CS/VBAC Prior CS. Desires TOL, scheduled for repeat CS 12/18 if NIL   Support Person                Discussed GBS positive results and need for antibiotic treatment in labor. Will send urine culture; UA is negative but she is experiencing some painful urination.  Term labor symptoms and general obstetric precautions including but not limited to vaginal bleeding, contractions, leaking of fluid and fetal movement were reviewed in detail with the patient.  Return in about 1 week (around 06/24/2017) for ROB.  Marcelyn BruinsJacelyn Schmid, CNM 06/25/2017  2:07 PM

## 2017-06-17 NOTE — Progress Notes (Signed)
C/o pressure on the bones, pain from belly button down to bottom, irreg ctxs., hurts a little to pee. rj

## 2017-06-19 LAB — URINE CULTURE

## 2017-06-24 ENCOUNTER — Ambulatory Visit (INDEPENDENT_AMBULATORY_CARE_PROVIDER_SITE_OTHER): Payer: Medicaid Other | Admitting: Maternal Newborn

## 2017-06-24 ENCOUNTER — Encounter: Payer: Self-pay | Admitting: Maternal Newborn

## 2017-06-24 VITALS — BP 100/70 | Wt 187.0 lb

## 2017-06-24 DIAGNOSIS — O0990 Supervision of high risk pregnancy, unspecified, unspecified trimester: Secondary | ICD-10-CM

## 2017-06-24 DIAGNOSIS — Z3A38 38 weeks gestation of pregnancy: Secondary | ICD-10-CM

## 2017-06-24 NOTE — Progress Notes (Signed)
Routine Prenatal Care Visit  Subjective  [name] Mcintyre is a 31 y.o. G3P1011 at 9121w2d being seen today for ongoing prenatal care.  She is currently monitored for the following issues for this high-risk pregnancy and has High risk pregnancy, antepartum and History of cesarean delivery affecting pregnancy on their problem list.  ----------------------------------------------------------------------------------- Patient reports occasional contractions and nausea, dizziness and shortness of breath with rapid position changes.   Contractions: Irregular. Vag. Bleeding: None.  Movement: Present. Denies leaking of fluid.  ----------------------------------------------------------------------------------- The following portions of the patient's history were reviewed and updated as appropriate: allergies, current medications, past family history, past medical history, past social history, past surgical history and problem list. Problem list updated.   Objective  Blood pressure 100/70, weight 187 lb (84.8 kg), last menstrual period 09/30/2016, unknown if currently breastfeeding. Pregravid weight 152 lb (68.9 kg) Total Weight Gain 35 lb (15.9 kg) Urinalysis: Urine Protein: Negative Urine Glucose: Negative  Fetal Status: Fetal Heart Rate (bpm): 140 Fundal Height: 38 cm Movement: Present  Presentation: Vertex  General:  Alert, oriented and cooperative. Patient is in no acute distress.  Skin: Skin is warm and dry. No rash noted.   Cardiovascular: Normal heart rate noted  Respiratory: Normal respiratory effort, no problems with respiration noted  Abdomen: Soft, gravid, appropriate for gestational age. Pain/Pressure: Present     Pelvic:  Cervical exam performed Dilation: Closed Effacement (%): 20 Station: Ballotable  Extremities: Normal range of motion.  Edema: Trace  Mental Status: Normal mood and affect. Normal behavior. Normal judgment and thought content.     Assessment   31 y.o. Z6X0960G3P1011 at  6721w2d, EDD 07/07/2017 by Last Menstrual Period presenting for routine prenatal visit  Plan   pregnancy3 Problems (from 09/30/16 to present)    Problem Noted Resolved   History of cesarean delivery affecting pregnancy 02/01/2017 by Conard NovakJackson, Stephen D, MD No   High risk pregnancy, antepartum 12/18/2016 by Nadara MustardHarris, Robert P, MD No   Overview Addendum 05/20/2017  1:16 PM by Farrel ConnersGutierrez, Colleen, CNM    [facility] Prenatal Labs  Dating LMP Blood type: B/Positive/-- (06/01 1011)   Genetic Screen 1 Screen: declines     AFP: declines Antibody:Negative (06/01 1011)  Anatomic US  Rubella: 1.51 (06/01 1011) Varicella: I  GTT 28 weeks: 132 RPR: Non Reactive (06/01 1011)   Rhogam NA HBsAg: Negative (06/01 1011)   TDaP vaccine       05/03/2017                Flu Shot:04/19/17 HIV: Non Reactive (06/01 1011)   Baby Food    Breast                            GBS:   Contraception  Pap:NIL 12/18/2016  CBB     CS/VBAC Prior CS. Desires TOL, scheduled for repeat CS 12/18 if NIL   Support Person                She mentioned that it seems like baby's movements have slowed down some. Reviewed [facility] and advised to come to office or go to Labor and Delivery triage for decreased fetal movements. Number given for L&D. Baby is active today on exam.  Term labor symptoms and general obstetric precautions including but not limited to vaginal bleeding, contractions, leaking of fluid and fetal movement were reviewed in detail with the patient.  Please refer to After Visit Summary for other counseling recommendations.  Return in about 1 week (around 07/01/2017) for ROB.  [name] Mcintyre, CNM 06/25/2017  2:01 PM

## 2017-06-24 NOTE — Patient Instructions (Signed)

## 2017-06-30 ENCOUNTER — Encounter: Payer: Self-pay | Admitting: Obstetrics and Gynecology

## 2017-06-30 ENCOUNTER — Ambulatory Visit (INDEPENDENT_AMBULATORY_CARE_PROVIDER_SITE_OTHER): Payer: Medicaid Other | Admitting: Obstetrics and Gynecology

## 2017-06-30 VITALS — BP 118/84 | HR 107 | Ht 59.0 in | Wt 190.0 lb

## 2017-06-30 DIAGNOSIS — O0990 Supervision of high risk pregnancy, unspecified, unspecified trimester: Secondary | ICD-10-CM

## 2017-06-30 DIAGNOSIS — O34219 Maternal care for unspecified type scar from previous cesarean delivery: Secondary | ICD-10-CM

## 2017-06-30 DIAGNOSIS — Z3A39 39 weeks gestation of pregnancy: Secondary | ICD-10-CM

## 2017-06-30 NOTE — Progress Notes (Addendum)
Obstetric H&P   Chief Complaint: Scheduled C-section  Prenatal Care Provider: [facility]  History of Present Illness: 31 y.o. G3P1011 8962w0d by 07/07/2017, by Last Menstrual Period =13 week US presenting to sign consents for scheduled repeat C-section 07/06/2017.  No LOF, no VB, +FM, irregular contractions.  Prenatal care thus far uncomplicated.     Pregravid weight 152 lb (68.9 kg) Total Weight Gain 35 lb (15.9 kg)  pregnancy3 Problems (from 09/30/16 to present)    Problem Noted Resolved   History of cesarean delivery affecting pregnancy 02/01/2017 by Conard NovakJackson, Stephen D, MD No   High risk pregnancy, antepartum 12/18/2016 by Nadara MustardHarris, Robert P, MD No   Overview Addendum 06/30/2017 10:46 AM by Vena AustriaStaebler, Kaylan Yates, MD    [facility] Prenatal Labs  Dating LMP = 13 week US Blood type: B/Positive/-- (06/01 1011)   Genetic Screen 1 Screen: declines     AFP: declines Antibody:Negative (06/01 1011)  Anatomic US Normal Female Rubella: 1.51 (06/01 1011) Varicella: Immune  GTT 28 weeks: 132 RPR: Non Reactive (06/01 1011)   Rhogam NA HBsAg: Negative (06/01 1011)   TDaP vaccine  05/03/2017 FluShot:04/19/17 HIV: Non Reactive (06/01 1011)   Baby Food   Breast                            GBS: positive  Contraception Condoms Pap:NIL 12/18/2016  CBB     CS/VBAC Prior CS. Desires TOL, scheduled for repeat CS 12/18 if NIL              Review of Systems: 10 point review of systems negative unless otherwise noted in HPI  Past Medical History: Past Medical History:  Diagnosis Date  . Infection    UTI    Past Surgical History: Past Surgical History:  Procedure Laterality Date  . CESAREAN SECTION  03/06/2014   PROM then FITL on day 2 of induction   Family History: Family History  Problem Relation Age of Onset  . Hypertension Maternal Grandmother   . Diabetes Paternal Grandmother     Social History: Social History   Socioeconomic History  . Marital status: Married    Spouse name: Not on file    . Number of children: Not on file  . Years of education: Not on file  . Highest education level: Not on file  Social Needs  . Financial resource strain: Not on file  . Food insecurity - worry: Not on file  . Food insecurity - inability: Not on file  . Transportation needs - medical: Not on file  . Transportation needs - non-medical: Not on file  Occupational History  . Not on file  Tobacco Use  . Smoking status: Never Smoker  . Smokeless tobacco: Never Used  Substance and Sexual Activity  . Alcohol use: No  . Drug use: No  . Sexual activity: Yes    Birth control/protection: None  Other Topics Concern  . Not on file  Social History Narrative  . Not on file    Medications: Prior to Admission medications   Medication Sig Start Date End Date Taking? Authorizing Provider  Prenatal Vit-Fe Fumarate-FA (PRENATAL MULTIVITAMIN) TABS tablet Take 1 tablet by mouth daily.     [provider]    Allergies: No Known Allergies  Physical Exam: Blood pressure 118/84, pulse (!) 107, height 4\' 11"  (1.499 m), weight 190 lb (86.2 kg), last menstrual period 09/30/2016, unknown if currently breastfeeding.  FHT: 145  General: NAD  HEENT: normocephalic, anicteric Pulmonary: No increased work of breathing Cardiovascular: RRR, distal pulses 2+ Abdomen: Gravid, non-tender Leopolds: vertex Genitourinary: FTP / 50 / -3 Extremities: no edema, erythema, or tenderness Neurologic: Grossly intact Psychiatric: mood appropriate, affect full  Labs: No results found for this or any previous visit (from the past 24 hour(s)).  Assessment: 31 y.o. G3P1011 6437w0d by 07/07/2017, by Last Menstrual Period = 13 week US presenting for scheduled Cesarean Section 07/06/18  Plan: 1) The patient was counseled regarding risk and benefits to proceeding with Cesarean section.  Risk of cesarean section were discussed including risk of bleeding and need for potential intraoperative or postoperative blood  transfusion with a rate of approximately 5% quoted for all Cesarean sections, risk of injury to adjacent organs including but not limited to bowl and bladder, the need for additional surgical procedures to address such injuries, and the risk of infection.    2) Fetus - normal FHT today on doppler  3) PNL - Blood type B/Positive/-- (06/01 1011) / Anti-bodyscreen Negative (06/01 1011) / Rubella 1.51 (06/01 1011) / Varicella Immune / RPR Non Reactive (10/01 0930) / HBsAg Negative (06/01 1011) / HIV   / 1-hr OGTT 132 / GBS Positive (11/21 1145)  4) Immunization History -  Immunization History  Administered Date(s) Administered  . Influenza,inj,Quad PF,6+ Mos 04/19/2017  . Influenza-Unspecified 04/19/2017  . Tdap 05/03/2017    5) Disposition - pending delivery and postoperative recovery

## 2017-07-02 ENCOUNTER — Other Ambulatory Visit: Payer: Self-pay

## 2017-07-02 ENCOUNTER — Inpatient Hospital Stay
Admission: EM | Admit: 2017-07-02 | Discharge: 2017-07-02 | Disposition: A | Discharge status: Home / Self Care | Payer: Medicaid Other | Attending: Obstetrics and Gynecology | Admitting: Obstetrics and Gynecology

## 2017-07-02 DIAGNOSIS — Z3A39 39 weeks gestation of pregnancy: Secondary | ICD-10-CM | POA: Diagnosis not present

## 2017-07-02 DIAGNOSIS — Z8249 Family history of ischemic heart disease and other diseases of the circulatory system: Secondary | ICD-10-CM | POA: Insufficient documentation

## 2017-07-02 DIAGNOSIS — Z833 Family history of diabetes mellitus: Secondary | ICD-10-CM | POA: Insufficient documentation

## 2017-07-02 DIAGNOSIS — O34219 Maternal care for unspecified type scar from previous cesarean delivery: Secondary | ICD-10-CM | POA: Insufficient documentation

## 2017-07-02 DIAGNOSIS — Z79899 Other long term (current) drug therapy: Secondary | ICD-10-CM | POA: Insufficient documentation

## 2017-07-02 DIAGNOSIS — O471 False labor at or after 37 completed weeks of gestation: Secondary | ICD-10-CM | POA: Diagnosis not present

## 2017-07-02 DIAGNOSIS — O479 False labor, unspecified: Secondary | ICD-10-CM

## 2017-07-02 DIAGNOSIS — O0990 Supervision of high risk pregnancy, unspecified, unspecified trimester: Secondary | ICD-10-CM

## 2017-07-02 NOTE — Final Progress Note (Signed)
Physician Final Progress Note  Patient ID: [name] BalintJennifer Boldman MRN: [id number] DOB/AGE: 07-22-1985 31 y.o.  Admit date: 07/02/2017 Admitting provider: Conard NovakStephen D Jackson, MD/ Gasper Lloydolleen L. Sharen HonesGutierrez, CNM Discharge date: 07/02/2017   Admission Diagnoses: IUP at 39wk2d with contractions Previous Cesarean section  Discharge Diagnoses: IUP at 39wk2d,  False labor Consults: None  Significant Findings/ Diagnostic Studies: 31 year old G3 P1011 with history of previous Cesarean section x 1, presented with complaints of contractions x 2 days, becoming more frequent since midnight last night. She desired trial of labor after Cesarean section.  Her cervix was 1cm (ext os)/ FT int os/-3/ vertex on arrival. Contractions were initially every 2-3 minutes and palpated mild. FHR was reactive with baseline 125 with accelerations to 150s with moderate variability. After monitoring for 2 hours, her cervix had not changed and her contractions spaced out to every 2-9 min apart. She was not in labor and  was discharged home with labor precautions.  Procedures: none  Discharge Condition: stable  Disposition: 01-Home or Self Care  Diet: Regular diet  Discharge Activity: Activity as tolerated   Allergies as of 07/02/2017   No Known Allergies     Medication List    TAKE these medications   prenatal multivitamin Tabs tablet Take 1 tablet by mouth daily.        Total time spent taking care of this patient: 20 minutes  Signed: Farrel Connersolleen Dalya Maselli 07/02/2017, 10:40 AM

## 2017-07-02 NOTE — OB Triage Note (Signed)
[name] here with c/o contractions that started 12/12, have over time increased in strength and are now 5-7 min apart. Previous c/s, desires TOLAC. Denies bleeding, LOF, reports positive fetal movement.

## 2017-07-02 NOTE — H&P (Signed)
OB History & Physical   History of Present Illness:  Chief Complaint:  Complains of contractions HPI:  [name] Mcintyre is a 31 y.o. 633P1011 female with EDC=07/07/2017 at 978w2d dated by LMP (09/30/2016).  Her pregnancy has been complicated by a history of a previous Cesarean section 3 years ago for FITL.Marland Kitchen.  She presents to L&D for evaluation of labor. She reports contractions x2 days which have become more regular since midnight last night. No leakage of fluid or bleeding.   She has been counseled on TOLAC vs Cesarean section and desires TOLAC. Has been scheduled for a repeat CS 12/18 if undelivered.   [facility] care [facility]  [facility] Prenatal Labs  Dating LMP = 13 week US Blood type: B/Positive/-- (06/01 1011)   Genetic Screen 1 Screen: declines  AFP: declines Antibody:Negative (06/01 1011)  Anatomic US Normal Female Rubella: 1.51 (06/01 1011) Varicella: Immune  GTT 28 weeks: 132 RPR: Non Reactive (06/01 1011)   Rhogam NA HBsAg: Negative (06/01 1011)   TDaP vaccine 05/03/2017 FluShot:04/19/17 HIV: Non Reactive (06/01 1011)   Baby Food Breast  GBS: positive  Contraception Condoms Pap:NIL 12/18/2016  CBB     CS/VBAC Prior CS. Desires TOL, scheduled for repeat CS 12/18 if NIL                Maternal Medical History:   Past Medical History:  Diagnosis Date  . Infection    UTI    Past Surgical History:  Procedure Laterality Date  . CESAREAN SECTION  03/06/2014   PROM then FITL on day 2 of induction  . RHINOPLASTY      No Known Allergies  Prior to Admission medications   Medication Sig Start Date End Date Taking? Authorizing Provider  Prenatal Vit-Fe Fumarate-FA (PRENATAL MULTIVITAMIN) TABS tablet Take 1 tablet by mouth daily.     [provider]          Social History: She  reports that  has never smoked. she has never used smokeless tobacco. She reports that she does not drink alcohol or use  drugs.  Family History: family history includes Diabetes in her paternal grandmother; Hypertension in her maternal grandmother.   Review of Systems: Negative x 10 systems reviewed except as noted in the HPI.      Physical Exam:  Vital Signs: BP 123/74 (BP Location: Right Arm)   Pulse (!) 101   Temp 98.1 F (36.7 C) (Oral)   Resp 18   Ht 4\' 11"  (1.499 m)   Wt 190 lb (86.2 kg)   LMP 09/30/2016   BMI 38.38 kg/m  General: no acute distress.  HEENT: normocephalic, atraumatic Heart: regular rate & rhythm.  No murmurs/rubs/gallops Lungs: clear to auscultation bilaterally Abdomen: soft, gravid, non-tender;  EFW: 7# Pelvic:   External: Normal external female genitalia  Cervix: Dilation: 1 / Effacement (%): 50 / Station: -3 /cephalic  Extremities: non-tender, symmetric, no edema bilaterally.  DTRs: +2  Neurologic: Alert & oriented x 3.   Baseline FHR: 125 with accelerations to 150s, moderate variability.  Toco: contractions q2-4 min apart    Assessment:  [name] BalintJennifer Stamas is a 31 y.o. 33P1011 female at 2778w2d with early labor Previous Cesarean section, desires TOLAC  Plan:  1. Admit to Labor & Delivery for observation   2. Clear liquids 3. GBS positive-will need PPX if she is admitted   4. Continuous monitoring Farrel ConnersColleen Ranessa Kosta  07/02/2017 9:13 AM

## 2017-07-04 ENCOUNTER — Other Ambulatory Visit: Payer: Self-pay

## 2017-07-04 ENCOUNTER — Inpatient Hospital Stay: Payer: Medicaid Other | Admitting: Anesthesiology

## 2017-07-04 ENCOUNTER — Encounter
Admission: EM | Disposition: A | Discharge status: Home / Self Care | Payer: Self-pay | Source: Home / Self Care | Attending: Obstetrics and Gynecology

## 2017-07-04 ENCOUNTER — Inpatient Hospital Stay
Admission: EM | Admit: 2017-07-04 | Discharge: 2017-07-06 | DRG: 787 | Disposition: A | Discharge status: Home / Self Care | Payer: Medicaid Other | Attending: Obstetrics and Gynecology | Admitting: Obstetrics and Gynecology

## 2017-07-04 DIAGNOSIS — Z3A39 39 weeks gestation of pregnancy: Secondary | ICD-10-CM

## 2017-07-04 DIAGNOSIS — O0990 Supervision of high risk pregnancy, unspecified, unspecified trimester: Secondary | ICD-10-CM

## 2017-07-04 DIAGNOSIS — O9081 Anemia of the puerperium: Secondary | ICD-10-CM | POA: Diagnosis not present

## 2017-07-04 DIAGNOSIS — O34211 Maternal care for low transverse scar from previous cesarean delivery: Principal | ICD-10-CM | POA: Diagnosis present

## 2017-07-04 DIAGNOSIS — D62 Acute posthemorrhagic anemia: Secondary | ICD-10-CM | POA: Diagnosis not present

## 2017-07-04 DIAGNOSIS — O34219 Maternal care for unspecified type scar from previous cesarean delivery: Secondary | ICD-10-CM

## 2017-07-04 LAB — CBC
HEMATOCRIT: 35 % (ref 35.0–47.0)
HEMOGLOBIN: 11.3 g/dL — AB (ref 12.0–16.0)
MCH: 25.4 pg — ABNORMAL LOW (ref 26.0–34.0)
MCHC: 32.3 g/dL (ref 32.0–36.0)
MCV: 78.5 fL — ABNORMAL LOW (ref 80.0–100.0)
Platelets: 283 10*3/uL (ref 150–440)
RBC: 4.46 MIL/uL (ref 3.80–5.20)
RDW: 14.9 % — AB (ref 11.5–14.5)
WBC: 9.3 10*3/uL (ref 3.6–11.0)

## 2017-07-04 LAB — RAPID HIV SCREEN (HIV 1/2 AB+AG)
HIV 1/2 ANTIBODIES: NONREACTIVE
HIV-1 P24 Antigen - HIV24: NONREACTIVE

## 2017-07-04 LAB — TYPE AND SCREEN
ABO/RH(D): B POS
Antibody Screen: NEGATIVE

## 2017-07-04 SURGERY — Surgical Case
Anesthesia: Spinal

## 2017-07-04 MED ORDER — DIPHENHYDRAMINE HCL 25 MG PO CAPS: 25.0000 mg | ORAL_CAPSULE | ORAL | Status: DC | PRN

## 2017-07-04 MED ORDER — LACTATED RINGERS IV BOLUS (SEPSIS)
1000.0000 mL | Freq: Once | INTRAVENOUS | Status: AC
  Administered 2017-07-04: 1000 mL via INTRAVENOUS

## 2017-07-04 MED ORDER — BUPIVACAINE ON-Q PAIN PUMP (FOR ORDER SET NO CHG)
INJECTION | Status: DC
  Filled 2017-07-04: qty 1

## 2017-07-04 MED ORDER — NALBUPHINE HCL 10 MG/ML IJ SOLN: 5.0000 mg | Freq: Once | INTRAMUSCULAR | Status: DC | PRN

## 2017-07-04 MED ORDER — NALOXONE HCL 0.4 MG/ML IJ SOLN
1.0000 ug/kg/h | INTRAVENOUS | Status: DC | PRN
  Filled 2017-07-04: qty 5

## 2017-07-04 MED ORDER — DIBUCAINE 1 % RE OINT: 1.0000 "application " | TOPICAL_OINTMENT | RECTAL | Status: DC | PRN

## 2017-07-04 MED ORDER — PRENATAL MULTIVITAMIN CH
1.0000 | ORAL_TABLET | Freq: Every day | ORAL | Status: DC
  Administered 2017-07-05 – 2017-07-06 (×2): 1 via ORAL
  Filled 2017-07-04 (×2): qty 1

## 2017-07-04 MED ORDER — CEFAZOLIN SODIUM-DEXTROSE 2-4 GM/100ML-% IV SOLN
2.0000 g | INTRAVENOUS | Status: DC
  Filled 2017-07-04: qty 100

## 2017-07-04 MED ORDER — LACTATED RINGERS IV SOLN
INTRAVENOUS | Status: DC
  Administered 2017-07-04 (×3): via INTRAVENOUS

## 2017-07-04 MED ORDER — IBUPROFEN 600 MG PO TABS
600.0000 mg | ORAL_TABLET | Freq: Four times a day (QID) | ORAL | Status: DC
  Administered 2017-07-05 – 2017-07-06 (×5): 600 mg via ORAL
  Filled 2017-07-04 (×7): qty 1

## 2017-07-04 MED ORDER — MORPHINE SULFATE (PF) 0.5 MG/ML IJ SOLN
INTRAMUSCULAR | Status: DC | PRN
  Administered 2017-07-04: .1 mg via EPIDURAL

## 2017-07-04 MED ORDER — BUPIVACAINE IN DEXTROSE 0.75-8.25 % IT SOLN
INTRATHECAL | Status: DC | PRN
  Administered 2017-07-04: 1.5 mL via INTRATHECAL

## 2017-07-04 MED ORDER — MENTHOL 3 MG MT LOZG
1.0000 | LOZENGE | OROMUCOSAL | Status: DC | PRN
  Filled 2017-07-04: qty 9

## 2017-07-04 MED ORDER — MEPERIDINE HCL 25 MG/ML IJ SOLN: 6.2500 mg | INTRAMUSCULAR | Status: DC | PRN

## 2017-07-04 MED ORDER — DIPHENHYDRAMINE HCL 50 MG/ML IJ SOLN: 12.5000 mg | INTRAMUSCULAR | Status: DC | PRN

## 2017-07-04 MED ORDER — KETOROLAC TROMETHAMINE 30 MG/ML IJ SOLN: 30.0000 mg | Freq: Four times a day (QID) | INTRAMUSCULAR | Status: DC | PRN

## 2017-07-04 MED ORDER — NALBUPHINE HCL 10 MG/ML IJ SOLN: 5.0000 mg | INTRAMUSCULAR | Status: DC | PRN

## 2017-07-04 MED ORDER — OXYTOCIN BOLUS FROM INFUSION
500.0000 mL | Freq: Once | INTRAVENOUS | Status: AC
  Administered 2017-07-04: 500 mL via INTRAVENOUS

## 2017-07-04 MED ORDER — COCONUT OIL OIL
1.0000 "application " | TOPICAL_OIL | Status: DC | PRN
  Filled 2017-07-04: qty 120

## 2017-07-04 MED ORDER — SOD CITRATE-CITRIC ACID 500-334 MG/5ML PO SOLN
30.0000 mL | ORAL | Status: AC
  Administered 2017-07-04: 30 mL via ORAL
  Filled 2017-07-04: qty 15

## 2017-07-04 MED ORDER — SENNOSIDES-DOCUSATE SODIUM 8.6-50 MG PO TABS
2.0000 | ORAL_TABLET | ORAL | Status: DC
  Administered 2017-07-05 – 2017-07-06 (×2): 2 via ORAL
  Filled 2017-07-04 (×2): qty 2

## 2017-07-04 MED ORDER — FENTANYL CITRATE (PF) 100 MCG/2ML IJ SOLN
INTRAMUSCULAR | Status: AC
  Filled 2017-07-04: qty 2

## 2017-07-04 MED ORDER — IBUPROFEN 600 MG PO TABS: 600.0000 mg | ORAL_TABLET | Freq: Four times a day (QID) | ORAL | Status: DC | PRN

## 2017-07-04 MED ORDER — NALOXONE HCL 0.4 MG/ML IJ SOLN: 0.4000 mg | INTRAMUSCULAR | Status: DC | PRN

## 2017-07-04 MED ORDER — LACTATED RINGERS IV SOLN: 500.0000 mL | INTRAVENOUS | Status: DC | PRN

## 2017-07-04 MED ORDER — OXYCODONE-ACETAMINOPHEN 5-325 MG PO TABS
2.0000 | ORAL_TABLET | ORAL | Status: DC | PRN
  Administered 2017-07-05: 2 via ORAL
  Filled 2017-07-04: qty 2

## 2017-07-04 MED ORDER — PROPOFOL 10 MG/ML IV BOLUS
INTRAVENOUS | Status: AC
  Filled 2017-07-04: qty 20

## 2017-07-04 MED ORDER — BUPIVACAINE 0.25 % ON-Q PUMP DUAL CATH 400 ML
400.0000 mL | INJECTION | Status: DC
  Filled 2017-07-04: qty 400

## 2017-07-04 MED ORDER — SIMETHICONE 80 MG PO CHEW: 80.0000 mg | CHEWABLE_TABLET | ORAL | Status: DC | PRN

## 2017-07-04 MED ORDER — MORPHINE SULFATE (PF) 0.5 MG/ML IJ SOLN
INTRAMUSCULAR | Status: AC
  Filled 2017-07-04: qty 10

## 2017-07-04 MED ORDER — WITCH HAZEL-GLYCERIN EX PADS: 1.0000 "application " | MEDICATED_PAD | CUTANEOUS | Status: DC | PRN

## 2017-07-04 MED ORDER — KETOROLAC TROMETHAMINE 30 MG/ML IJ SOLN
30.0000 mg | Freq: Four times a day (QID) | INTRAMUSCULAR | Status: DC | PRN
  Administered 2017-07-04 – 2017-07-05 (×3): 30 mg via INTRAVENOUS
  Filled 2017-07-04 (×3): qty 1

## 2017-07-04 MED ORDER — SOD CITRATE-CITRIC ACID 500-334 MG/5ML PO SOLN: 30.0000 mL | ORAL | Status: DC

## 2017-07-04 MED ORDER — ACETAMINOPHEN 325 MG PO TABS: 650.0000 mg | ORAL_TABLET | ORAL | Status: DC | PRN

## 2017-07-04 MED ORDER — ONDANSETRON HCL 4 MG/2ML IJ SOLN: 4.0000 mg | Freq: Four times a day (QID) | INTRAMUSCULAR | Status: DC | PRN

## 2017-07-04 MED ORDER — OXYTOCIN 40 UNITS IN LACTATED RINGERS INFUSION - SIMPLE MED
2.5000 [IU]/h | INTRAVENOUS | Status: DC
  Administered 2017-07-04: 400 mL via INTRAVENOUS
  Filled 2017-07-04: qty 1000

## 2017-07-04 MED ORDER — BUPIVACAINE HCL 0.5 % IJ SOLN
INTRAMUSCULAR | Status: DC | PRN
  Administered 2017-07-04: 10 mL

## 2017-07-04 MED ORDER — SODIUM CHLORIDE 0.9 % IV SOLN
INTRAVENOUS | Status: DC | PRN
  Administered 2017-07-04: 40 ug/min via INTRAVENOUS

## 2017-07-04 MED ORDER — ONDANSETRON HCL 4 MG/2ML IJ SOLN: 4.0000 mg | Freq: Once | INTRAMUSCULAR | Status: DC | PRN

## 2017-07-04 MED ORDER — LACTATED RINGERS IV SOLN
INTRAVENOUS | Status: DC
  Administered 2017-07-05: 05:00:00 via INTRAVENOUS

## 2017-07-04 MED ORDER — ACETAMINOPHEN 500 MG PO TABS
1000.0000 mg | ORAL_TABLET | Freq: Four times a day (QID) | ORAL | Status: AC
  Administered 2017-07-04 – 2017-07-05 (×4): 1000 mg via ORAL
  Filled 2017-07-04 (×4): qty 2

## 2017-07-04 MED ORDER — DEXTROSE 5 % IV SOLN
2.0000 g | INTRAVENOUS | Status: AC
  Administered 2017-07-04 (×2): 2 g via INTRAVENOUS
  Filled 2017-07-04: qty 2000

## 2017-07-04 MED ORDER — FENTANYL CITRATE (PF) 100 MCG/2ML IJ SOLN
50.0000 ug | INTRAMUSCULAR | Status: DC | PRN
  Administered 2017-07-04 (×2): 50 ug via INTRAVENOUS
  Administered 2017-07-04: 100 ug via INTRAVENOUS
  Filled 2017-07-04: qty 2

## 2017-07-04 MED ORDER — BUPIVACAINE HCL (PF) 0.5 % IJ SOLN
20.0000 mL | INTRAMUSCULAR | Status: DC
  Administered 2017-07-04: 20 mL
  Filled 2017-07-04: qty 30

## 2017-07-04 MED ORDER — ONDANSETRON HCL 4 MG/2ML IJ SOLN
INTRAMUSCULAR | Status: DC | PRN
  Administered 2017-07-04: 4 mg via INTRAVENOUS

## 2017-07-04 MED ORDER — DEXTROSE 5 % IV SOLN
500.0000 mg | Freq: Once | INTRAVENOUS | Status: DC
  Filled 2017-07-04: qty 500

## 2017-07-04 MED ORDER — SIMETHICONE 80 MG PO CHEW
80.0000 mg | CHEWABLE_TABLET | ORAL | Status: DC
  Administered 2017-07-05 – 2017-07-06 (×2): 80 mg via ORAL
  Filled 2017-07-04 (×2): qty 1

## 2017-07-04 MED ORDER — OXYCODONE-ACETAMINOPHEN 5-325 MG PO TABS
1.0000 | ORAL_TABLET | ORAL | Status: DC | PRN
  Administered 2017-07-05: 1 via ORAL
  Filled 2017-07-04: qty 1

## 2017-07-04 MED ORDER — SIMETHICONE 80 MG PO CHEW
80.0000 mg | CHEWABLE_TABLET | Freq: Three times a day (TID) | ORAL | Status: DC
  Administered 2017-07-04 – 2017-07-06 (×6): 80 mg via ORAL
  Filled 2017-07-04 (×7): qty 1

## 2017-07-04 MED ORDER — DEXTROSE 5 % IV SOLN
500.0000 mg | Freq: Once | INTRAVENOUS | Status: AC
  Administered 2017-07-04: 500 mg via INTRAVENOUS
  Filled 2017-07-04: qty 500

## 2017-07-04 MED ORDER — FENTANYL CITRATE (PF) 100 MCG/2ML IJ SOLN: 25.0000 ug | INTRAMUSCULAR | Status: DC | PRN

## 2017-07-04 MED ORDER — SODIUM CHLORIDE 0.9% FLUSH: 3.0000 mL | INTRAVENOUS | Status: DC | PRN

## 2017-07-04 MED ORDER — OXYTOCIN 40 UNITS IN LACTATED RINGERS INFUSION - SIMPLE MED
2.5000 [IU]/h | INTRAVENOUS | Status: DC
  Administered 2017-07-05: 2.5 [IU]/h via INTRAVENOUS
  Filled 2017-07-04: qty 1000

## 2017-07-04 MED ORDER — DIPHENHYDRAMINE HCL 25 MG PO CAPS: 25.0000 mg | ORAL_CAPSULE | Freq: Four times a day (QID) | ORAL | Status: DC | PRN

## 2017-07-04 SURGICAL SUPPLY — 26 items
BAG COUNTER SPONGE EZ (MISCELLANEOUS) ×2 IMPLANT
CANISTER SUCT 3000ML PPV (MISCELLANEOUS) ×2 IMPLANT
CATH KIT ON-Q SILVERSOAK 5IN (CATHETERS) ×4 IMPLANT
CHLORAPREP W/TINT 26ML (MISCELLANEOUS) ×4 IMPLANT
DERMABOND ADVANCED (GAUZE/BANDAGES/DRESSINGS) ×1
DERMABOND ADVANCED .7 DNX12 (GAUZE/BANDAGES/DRESSINGS) ×1 IMPLANT
DRSG OPSITE POSTOP 4X10 (GAUZE/BANDAGES/DRESSINGS) ×2 IMPLANT
DRSG TELFA 3X8 NADH (GAUZE/BANDAGES/DRESSINGS) ×2 IMPLANT
ELECT CAUTERY BLADE 6.4 (BLADE) ×2 IMPLANT
ELECT REM PT RETURN 9FT ADLT (ELECTROSURGICAL) ×2
ELECTRODE REM PT RTRN 9FT ADLT (ELECTROSURGICAL) ×1 IMPLANT
GAUZE SPONGE 4X4 12PLY STRL (GAUZE/BANDAGES/DRESSINGS) ×2 IMPLANT
GLOVE BIO SURGEON STRL SZ7 (GLOVE) ×10 IMPLANT
GLOVE INDICATOR 7.5 STRL GRN (GLOVE) ×2 IMPLANT
GOWN STRL REUS W/ TWL LRG LVL3 (GOWN DISPOSABLE) ×3 IMPLANT
GOWN STRL REUS W/TWL LRG LVL3 (GOWN DISPOSABLE) ×3
NS IRRIG 1000ML POUR BTL (IV SOLUTION) ×2 IMPLANT
PACK C SECTION AR (MISCELLANEOUS) ×2 IMPLANT
PAD OB MATERNITY 4.3X12.25 (PERSONAL CARE ITEMS) ×2 IMPLANT
PAD PREP 24X41 OB/GYN DISP (PERSONAL CARE ITEMS) ×2 IMPLANT
STRIP CLOSURE SKIN 1/2X4 (GAUZE/BANDAGES/DRESSINGS) ×2 IMPLANT
SUT MNCRL AB 4-0 PS2 18 (SUTURE) ×2 IMPLANT
SUT PDS AB 1 TP1 96 (SUTURE) ×4 IMPLANT
SUT VIC AB 0 CTX 36 (SUTURE) ×2
SUT VIC AB 0 CTX36XBRD ANBCTRL (SUTURE) ×2 IMPLANT
SUT VIC AB 2-0 CT1 36 (SUTURE) ×2 IMPLANT

## 2017-07-04 NOTE — Op Note (Signed)
Preoperative Diagnosis: 1) 31 y.o. G3P1011 at 1814w4d 2) History of prior cesarean section 3) Rupture of membranes and term labor  Postoperative Diagnosis: 1) 31 y.o. O1H0865G3P2012 at 3314w4d 2) History of prior cesarean section 3) Rupture of membranes and term labor  Operation Performed: Repeat low transverse C-section via pfannenstiel skin incision  Indiciation: History of prior cesarean section presenting in early labor  Anesthesia: .Spinal  Primary Surgeon: Vena AustriaAndreas Devun Anna, MD   Assistant: Maryruth EveJaci Schmid, CNM  Preoperative Antibiotics: 2g ancef and 500mg  of azithromycin  Estimated Blood Loss: 600 mL  IV Fluids: 800mL  Urine Output:: 200mL  Drains or Tubes: Foley to gravity drainage, ON-Q catheter system  Implants: none  Specimens Removed: none  Complications: none  Intraoperative Findings:  Normal tubes ovaries and uterus.  Delivery resulted in the birth of a liveborn female, APGAR (1 MIN): 9   APGAR (5 MINS): 9   Weight 3220g or 7lbs 2oz  Patient Condition:stable  Procedure in Detail:  Patient was taken to the operating room were she was administered regional anesthesia.  She was positioned in the supine position, prepped and draped in the  Usual sterile fashion.  Prior to proceeding with the case a time out was performed and the level of anesthetic was checked and noted to be adequate.  Utilizing the scalpel a pfannenstiel skin incision was made 2cm above the pubic symphysis utilizing the patient's pre-existing scar and carried down sharply to the the level of the rectus fascia.  The fascia was incised in the midline using the scalpel and then extended using mayo scissors.  The superior border of the rectus fascia was grasped with two Kocher clamps and the underlying rectus muscles were dissected of the fascia using blunt dissection.  The median raphae was incised using Mayo scissors.   The inferior border of the rectus fascia was dissected of the rectus muscles in a similar  fashion.  The midline was identified, the peritoneum was entered bluntly and expanded using manual tractions.  The uterus was noted to be in a none rotated position.  Next the bladder blade was placed retracting the bladder caudally.  A bladder flap was not created.  A low transverse incision was scored on the lower uterine segment.  The hysterotomy was entered bluntly using the operators finger.  The hysterotomy incision was extended using manual traction.  The operators hand was placed within the hysterotomy position noting the fetus to be within the OA position.  The vertex was grasped, flexed, brought to the incision, and delivered a traumatically using fundal pressure.  The remainder of the body delivered with ease.  The infant was suctioned, cord was clamped and cut before handing off to the awaiting neonatologist.  The placenta was delivered using manual extraction.  The uterus was exteriorized, wiped clean of clots and debris using two moist laps.  The hysterotomy was closed using a two layer closure of 0 Vicryl, with the first being a running locked, the second a vertical imbricating.  The uterus was returned to the abdomen.  The peritoneal gutters were wiped clean of clots and debris using two moist laps.  The hysterotomy incision was re-inspected noted to be hemostatic. The rectus muscles were inspected noted to be hemostatic.  The superior border of the rectus fascia was grasped with a Kocher clamp.  The ON-Q trocars were then placed 4cm above the superior border of the incision and tunneled subfascially.  The introducers were removed and the catheters were threaded through the sleeves  after which the sleeves were removed.  The fascia was closed using a looped #1 PDS in a running fashion taking 1cm by 1cm bites.  The subcutaneous tissue was irrigated using warm saline, hemostasis achieved using the bovie.  The subcutaneous dead space was greater than 3cm and was closed.  The subcutaneous dead space was  obliterated by using a 53-T 0 Chromic in a running fashion.  The skin was closed using 4-0 Monocryl in a subcuticular fashion.  Sponge needle and instrument counts were corrects times two.  The patient tolerated the procedure well and was taken to the recovery room in stable condition.

## 2017-07-04 NOTE — OB Triage Note (Signed)
Patient c/o of contractions that started early yesterday (07/03/17). Contractions are now closer together and stronger. Rating pain 10 out of 10 in the lower back. Denies vaginal bleeding. Denies LOF. Reports mucous discharge. Reports positive FM.

## 2017-07-04 NOTE — Anesthesia Post-op Follow-up Note (Signed)
Anesthesia QCDR form completed.        

## 2017-07-04 NOTE — H&P (Signed)
Date of Initial H&P: 06/30/2017  History reviewed, patient examined, no change in status, stable for surgery.  

## 2017-07-04 NOTE — Transfer of Care (Signed)
Immediate Anesthesia Transfer of Care Note  Patient: [name] BalintJennifer Mcintyre  Procedure(s) Performed: CESAREAN SECTION (N/A )  Patient Location: PACU  Anesthesia Type:Spinal  Level of Consciousness: awake, alert  and oriented  Airway & Oxygen Therapy: Patient Spontanous Breathing  Post-op Assessment: Report given to RN and Post -op Vital signs reviewed and stable  Post vital signs: Reviewed and stable  Last Vitals:  Vitals:   07/04/17 1102 07/04/17 1251  BP: 124/72 124/77  Pulse: 80 78  Resp: 16 16  Temp: 36.6 C 36.6 C    Last Pain:  Vitals:   07/04/17 1251  TempSrc: Oral  PainSc:       Patients Stated Pain Goal: 0 (07/04/17 1036)  Complications: No apparent anesthesia complications

## 2017-07-04 NOTE — Anesthesia Procedure Notes (Signed)
Date/Time: 07/04/2017 2:13 PM Performed by: Junious SilkNoles, Archana Eckman, CRNA Pre-anesthesia Checklist: Patient identified, Emergency Drugs available, Suction available, Patient being monitored and Timeout performed Oxygen Delivery Method: Nasal cannula

## 2017-07-04 NOTE — Discharge Summary (Addendum)
Obstetric Discharge Summary Reason for Admission: onset of labor Prenatal Procedures: none Intrapartum Procedures: cesarean: low cervical, transverse Postpartum Procedures: none Complications-Operative and Postpartum: none Hemoglobin  Date Value Ref Range Status  07/05/2017 8.4 (L) 12.0 - 16.0 g/dL Final  65/78/469610/07/2016 9.9 (L) 11.1 - 15.9 g/dL Final   HCT  Date Value Ref Range Status  07/05/2017 25.7 (L) 35.0 - 47.0 % Final   Hematocrit  Date Value Ref Range Status  04/19/2017 32.1 (L) 34.0 - 46.6 % Final   Physical Exam:  General: alert, cooperative and no distress Lochia: appropriate Uterine Fundus: firm Incision: healing well DVT Evaluation: No evidence of DVT seen on physical exam.  Discharge Diagnoses: Term Pregnancy-delivered  Discharge Information: Date: 07/06/2017 Activity: pelvic rest no lifting >10lbs x 6 weeks Diet: routine Allergies as of 07/06/2017   No Known Allergies     Medication List    TAKE these medications   oxyCODONE-acetaminophen 5-325 MG tablet Commonly known as:  PERCOCET/ROXICET Take 2 tablets by mouth every 4 (four) hours as needed (pain scale > 7).   prenatal multivitamin Tabs tablet Take 1 tablet by mouth daily.   Colace twice daily for stool softener   Condition: stable Discharge to: home Follow-up Information    Vena AustriaStaebler, Andreas, MD Follow up in 1 week(s).   Specialty:  Obstetrics and Gynecology Why:  For wound re-check Contact information: [address] [place] [address] [phone]         Newborn Data: Live born female  Birth Weight: 7 lb 1.6 oz (3220 g) APGAR: 9, 9  Newborn Delivery   Birth date/time:  07/04/2017 14:04:00 Delivery type:  C-Section, Low Transverse C-section categorization:  Repeat    Home with mother.  [name] Mcintyre 07/06/2017, 7:08 AM

## 2017-07-04 NOTE — Anesthesia Procedure Notes (Addendum)
Spinal  Patient location during procedure: OR Start time: 07/04/2017 1:35 PM End time: 07/04/2017 1:40 PM Staffing Resident/CRNA: Nelda Marseille, CRNA Performed: resident/CRNA  Preanesthetic Checklist Completed: patient identified, site marked, surgical consent, pre-op evaluation, timeout performed, IV checked, risks and benefits discussed and monitors and equipment checked Spinal Block Patient position: sitting Prep: Betadine Patient monitoring: heart rate, continuous pulse ox, blood pressure and cardiac monitor Approach: midline Location: L4-5 Injection technique: single-shot Needle Needle type: Whitacre and Introducer  Needle gauge: 25 G Needle length: 9 cm Additional Notes Negative paresthesia. Negative blood return. Positive free-flowing CSF. Expiration date of kit checked and confirmed. Patient tolerated procedure well, without complications.

## 2017-07-04 NOTE — Anesthesia Preprocedure Evaluation (Signed)
Anesthesia Evaluation  Patient identified by MRN, date of birth, ID band Patient awake    Reviewed: Allergy & Precautions, NPO status , Patient's Chart, lab work & pertinent test results  History of Anesthesia Complications Negative for: history of anesthetic complications  Airway Mallampati: III       Dental   Pulmonary neg sleep apnea, neg COPD,           Cardiovascular (-) hypertension(-) Past MI and (-) CHF (-) dysrhythmias (-) Valvular Problems/Murmurs     Neuro/Psych neg Seizures    GI/Hepatic Neg liver ROS, GERD (with pregnancy)  ,  Endo/Other  neg diabetes  Renal/GU negative Renal ROS     Musculoskeletal   Abdominal   Peds  Hematology   Anesthesia Other Findings   Reproductive/Obstetrics                             Anesthesia Physical Anesthesia Plan  ASA: II and emergent  Anesthesia Plan: Spinal   Post-op Pain Management:    Induction:   PONV Risk Score and Plan:   Airway Management Planned:   Additional Equipment:   Intra-op Plan:   Post-operative Plan:   Informed Consent: I have reviewed the patients History and Physical, chart, labs and discussed the procedure including the risks, benefits and alternatives for the proposed anesthesia with the patient or authorized representative who has indicated his/her understanding and acceptance.     Plan Discussed with:   Anesthesia Plan Comments:         Anesthesia Quick Evaluation

## 2017-07-05 ENCOUNTER — Inpatient Hospital Stay: Admission: RE | Admit: 2017-07-05 | Payer: Self-pay | Source: Ambulatory Visit

## 2017-07-05 ENCOUNTER — Encounter: Payer: Self-pay | Admitting: Obstetrics and Gynecology

## 2017-07-05 LAB — CBC
HCT: 25.7 % — ABNORMAL LOW (ref 35.0–47.0)
Hemoglobin: 8.4 g/dL — ABNORMAL LOW (ref 12.0–16.0)
MCH: 25.8 pg — AB (ref 26.0–34.0)
MCHC: 32.5 g/dL (ref 32.0–36.0)
MCV: 79.2 fL — AB (ref 80.0–100.0)
PLATELETS: 187 10*3/uL (ref 150–440)
RBC: 3.24 MIL/uL — AB (ref 3.80–5.20)
RDW: 14.6 % — ABNORMAL HIGH (ref 11.5–14.5)
WBC: 6.8 10*3/uL (ref 3.6–11.0)

## 2017-07-05 MED ORDER — VITAMIN C 500 MG PO TABS
500.0000 mg | ORAL_TABLET | Freq: Two times a day (BID) | ORAL | Status: DC
  Administered 2017-07-05 – 2017-07-06 (×3): 500 mg via ORAL
  Filled 2017-07-05 (×3): qty 1

## 2017-07-05 MED ORDER — SALINE SPRAY 0.65 % NA SOLN
1.0000 | NASAL | Status: DC | PRN
  Administered 2017-07-05: 1 via NASAL
  Filled 2017-07-05: qty 44

## 2017-07-05 MED ORDER — FERROUS SULFATE 325 (65 FE) MG PO TABS
325.0000 mg | ORAL_TABLET | Freq: Two times a day (BID) | ORAL | Status: DC
  Administered 2017-07-05 – 2017-07-06 (×2): 325 mg via ORAL
  Filled 2017-07-05 (×2): qty 1

## 2017-07-05 NOTE — Anesthesia Post-op Follow-up Note (Addendum)
  Anesthesia Pain Follow-up Note  Patient: [name] Mcintyre  Day #: 1  Date of Follow-up: 07/05/2017 Time: 7:31 AM  Last Vitals:  Vitals:   07/05/17 0604 07/05/17 0620  BP: (!) 92/47 98/60  Pulse: 70 80  Resp: 18 18  Temp:    SpO2: 100% 100%    Level of Consciousness: alert  Pain: none   Side Effects:None  Catheter Site Exam:clean, dry, no drainage     Plan: D/C from anesthesia care at surgeon's request  Karoline Caldwelleana Elaine Middleton

## 2017-07-05 NOTE — Anesthesia Postprocedure Evaluation (Signed)
Anesthesia Post Note  Patient: [name] Mcintyre  Procedure(s) Performed: CESAREAN SECTION (N/A )  Patient location during evaluation: Mother Baby Anesthesia Type: Spinal Level of consciousness: awake, awake and alert and oriented Pain management: pain level controlled Vital Signs Assessment: post-procedure vital signs reviewed and stable Respiratory status: spontaneous breathing Cardiovascular status: blood pressure returned to baseline Postop Assessment: no headache, no backache, no apparent nausea or vomiting and adequate PO intake Anesthetic complications: no     Last Vitals:  Vitals:   07/05/17 0604 07/05/17 0620  BP: (!) 92/47 98/60  Pulse: 70 80  Resp: 18 18  Temp:    SpO2: 100% 100%    Last Pain:  Vitals:   07/05/17 0620  TempSrc:   PainSc: 0-No pain                 Obediah Welles [name]

## 2017-07-05 NOTE — Progress Notes (Signed)
POD #1 repeat LTCS Subjective:   Tolerated regular diet this AM. Baby breast feeding without difficulty. Complains of dry nasal passage-would like saline nasal spray ordered.   Objective:  Blood pressure (!) 96/56, pulse 73, temperature (!) 97.3 F (36.3 C), temperature source Oral, resp. rate 18, height 4\' 11"  (1.499 m), weight 85.3 kg (188 lb), last menstrual period 09/30/2016, SpO2 100 %, unknown if currently breastfeeding.  Urine output picked up significantly once the IV PItocin was discontinued. Urine output from 6-8 AM was 1300 ml. Total urine output in past 24 hours was 1520 ml.   General: WF in NAD, appears pale Pulmonary: no increased work of breathing/ CTAB Heart: RRR without murmur Abdomen: non-distended, non-tender, fundus firm at level of umbilicus-1FB/ ML/ NT Incision:Honey comb dressing intact with some dried blood on lower mid edge of dressing, ON Q intact Extremities: no edema, no erythema, no tenderness  Results for orders placed or performed during the hospital encounter of 07/04/17 (from the past 72 hour(s))  Type and screen [facility]     Status: None   Collection Time: 07/04/17  9:28 AM  Result Value Ref Range   ABO/RH(D) B POS    Antibody Screen NEG    Sample Expiration 07/07/2017   Rapid HIV screen (HIV 1/2 Ab+Ag) ([facility] Only)     Status: None   Collection Time: 07/04/17  9:28 AM  Result Value Ref Range   HIV-1 P24 Antigen - HIV24 NON REACTIVE NON REACTIVE   HIV 1/2 Antibodies NON REACTIVE NON REACTIVE   Interpretation (HIV Ag Ab)      A non reactive test result means that HIV 1 or HIV 2 antibodies and HIV 1 p24 antigen were not detected in the specimen.  CBC     Status: Abnormal   Collection Time: 07/04/17  9:28 AM  Result Value Ref Range   WBC 9.3 3.6 - 11.0 K/uL   RBC 4.46 3.80 - 5.20 MIL/uL   Hemoglobin 11.3 (L) 12.0 - 16.0 g/dL   HCT 45.435.0 09.835.0 - 11.947.0 %   MCV 78.5 (L) 80.0 - 100.0 fL   MCH 25.4 (L) 26.0 - 34.0 pg   MCHC 32.3  32.0 - 36.0 g/dL   RDW 14.714.9 (H) 82.911.5 - 56.214.5 %   Platelets 283 150 - 440 K/uL  CBC     Status: Abnormal   Collection Time: 07/05/17  5:19 AM  Result Value Ref Range   WBC 6.8 3.6 - 11.0 K/uL   RBC 3.24 (L) 3.80 - 5.20 MIL/uL   Hemoglobin 8.4 (L) 12.0 - 16.0 g/dL   HCT 13.025.7 (L) 86.535.0 - 78.447.0 %   MCV 79.2 (L) 80.0 - 100.0 fL   MCH 25.8 (L) 26.0 - 34.0 pg   MCHC 32.5 32.0 - 36.0 g/dL   RDW 69.614.6 (H) 29.511.5 - 28.414.5 %   Platelets 187 150 - 440 K/uL     Assessment/Plan   31 y.o. [id number] postoperativeday # 1-stable  DC foley after lunch   Ambulate -if not lightheaded, can discontinue IV or saline  lock   Regular diet   Saline nasal spray   1) Acute blood loss anemia - hemodynamically stable and asymptomatic - po ferrous sulfate and vitamin C  2)B POS/ RI/ VI  3) TDAP UTD  4) Breast/ condoms  5) Discharge-probably POD #3  Farrel Connersolleen Imara Standiford, CNM

## 2017-07-05 NOTE — Plan of Care (Signed)
Patent received LR bolus for low BP's and low foley urine output (see I/O). BP improved and foley urine output improved; this a.m. BP was 90'/40's and foley urine output low again; CNM notified and IV fluids with Pitocin discontinued; IV fluids changed to LR @ 125. Fundus firm; small rubra lochia; breastfeeding infant with good technique observed; husband at bedside and attentive; patient TCDB well every 2 hours WA with assist.

## 2017-07-06 ENCOUNTER — Encounter: Admission: RE | Payer: Self-pay | Source: Ambulatory Visit

## 2017-07-06 ENCOUNTER — Inpatient Hospital Stay
Admission: RE | Admit: 2017-07-06 | Payer: Medicaid Other | Source: Ambulatory Visit | Admitting: Obstetrics and Gynecology

## 2017-07-06 LAB — RPR: RPR Ser Ql: NONREACTIVE

## 2017-07-06 SURGERY — Surgical Case
Anesthesia: Choice

## 2017-07-06 MED ORDER — OXYCODONE-ACETAMINOPHEN 5-325 MG PO TABS: 2.0000 | ORAL_TABLET | ORAL | 0 refills | Status: DC | PRN

## 2017-07-06 NOTE — Discharge Instructions (Signed)

## 2017-07-06 NOTE — Progress Notes (Signed)
Discharge to home with NB.  To car via w/c

## 2017-07-06 NOTE — Progress Notes (Signed)
Admit Date: 07/04/2017 Today's Date: 07/06/2017  Subjective: Postpartum Day 2: Cesarean Delivery Patient reports tolerating PO, + flatus and no problems voiding.    Objective: Vital signs in last 24 hours: Temp:  [97.3 F (36.3 C)-97.8 F (36.6 C)] 97.4 F (36.3 C) (12/18 0043) Pulse Rate:  [73-84] 81 (12/18 0043) Resp:  [16-18] 16 (12/18 0043) BP: (96-114)/(56-67) 114/64 (12/18 0043) SpO2:  [100 %] 100 % (12/18 0043)  Physical Exam:  General: alert, cooperative and no distress Lochia: appropriate Uterine Fundus: firm Incision: healing well DVT Evaluation: No evidence of DVT seen on physical exam.  Recent Labs    07/04/17 0928 07/05/17 0519  HGB 11.3* 8.4*  HCT 35.0 25.7*    Assessment/Plan: Status post Cesarean section. Doing well postoperatively.  Discharge home with standard precautions and return to clinic in 1 weeks.  [name] Mcintyre 07/06/2017, 7:11 AM

## 2017-07-06 NOTE — Progress Notes (Signed)
Discharge inst reviewed with pt.  Verb u/o 

## 2017-07-14 ENCOUNTER — Ambulatory Visit: Payer: Medicaid Other | Admitting: Obstetrics and Gynecology

## 2017-07-14 ENCOUNTER — Ambulatory Visit (INDEPENDENT_AMBULATORY_CARE_PROVIDER_SITE_OTHER): Payer: Medicaid Other | Admitting: Obstetrics and Gynecology

## 2017-07-14 ENCOUNTER — Encounter: Payer: Self-pay | Admitting: Obstetrics and Gynecology

## 2017-07-14 VITALS — BP 110/76 | HR 95 | Wt 172.0 lb

## 2017-07-14 DIAGNOSIS — Z09 Encounter for follow-up examination after completed treatment for conditions other than malignant neoplasm: Secondary | ICD-10-CM

## 2017-07-14 NOTE — Progress Notes (Signed)
      Postoperative Follow-up [facility], history of prior C-section.  Subjective: Patient reports marked improvement in her preop symptoms. Eating a regular diet without difficulty. Pain is controlled without any medications.  Activity: normal activities of daily living.  Objective: Vitals:   07/14/17 1228  BP: 110/76  Pulse: 95   Gen: NAD Abdomen: soft, non-tender, non-distended, incision D/C/I suture line Ext: no edema  Assessment: 31 y.o. s/p RLTCS  stable  Plan: Patient has done well after surgery with no apparent complications.  I have discussed the post-operative course to date, and the expected progress moving forward.  The patient understands what complications to be concerned about.  I will see the patient in routine follow up, or sooner if needed.    Activity plan: No heavy lifting.  Contraception : Condoms Pap: 12/18/2016 NIL  Vena AustriaAndreas Tomika Eckles 07/14/2017, 12:38 PM

## 2017-08-16 ENCOUNTER — Encounter: Payer: Self-pay | Admitting: Obstetrics and Gynecology

## 2017-08-16 ENCOUNTER — Ambulatory Visit (INDEPENDENT_AMBULATORY_CARE_PROVIDER_SITE_OTHER): Payer: Medicaid Other | Admitting: Obstetrics and Gynecology

## 2017-08-16 MED ORDER — METOCLOPRAMIDE HCL 10 MG PO TABS: 10.0000 mg | ORAL_TABLET | Freq: Four times a day (QID) | ORAL | 2 refills | Status: AC | PRN

## 2017-08-16 NOTE — Progress Notes (Signed)
Postpartum Visit  Chief Complaint:  Chief Complaint  Patient presents with  . Postpartum Care    C/S 07/04/17  . pain in hands    History of Present Illness: Patient is a 32 y.o. [id number] presents for postpartum visit.   Review the Delivery Report for details.  Date of delivery: 07/04/2017 Cesarean Section: Elective repeat Pregnancy or labor problems:  no Any problems since the delivery:  Yes some wrist pain since delivery, also concern about low milk supply still  Newborn Details:  SINGLETON :  1. Baby's gender: female Birth weight: 7lbs 2oz Maternal Details:  Breast Feeding:  yes Post partum depression/anxiety noted:  no Date of last PAP: 12/18/16 NIL  Review of Systems: Review of Systems  Constitutional: Negative for chills and fever.  HENT: Negative for congestion.   Respiratory: Negative for cough and shortness of breath.   Cardiovascular: Negative for chest pain and palpitations.  Gastrointestinal: Negative for abdominal pain, constipation, diarrhea, heartburn, nausea and vomiting.  Genitourinary: Negative for dysuria, frequency and urgency.  Skin: Negative for itching and rash.  Neurological: Negative for dizziness and headaches.  Endo/Heme/Allergies: Negative for polydipsia.  Psychiatric/Behavioral: Negative for depression.    The following portions of the patient's history were reviewed and updated as appropriate: allergies, current medications, past family history, past medical history, past social history, past surgical history and problem list.  Past Medical History:  Past Medical History:  Diagnosis Date  . Infection    UTI    Past Surgical History:  Past Surgical History:  Procedure Laterality Date  . CESAREAN SECTION  03/06/2014   PROM then FITL on day 2 of induction  . CESAREAN SECTION N/A 07/04/2017   Procedure: CESAREAN SECTION;  Surgeon: Vena AustriaStaebler, Rockne Dearinger, MD;  Location: [facility] ORS;  Service: Obstetrics;  Laterality: N/A;  . RHINOPLASTY       Family History:  Family History  Problem Relation Age of Onset  . Hypertension Maternal Grandmother   . Diabetes Paternal Grandmother     Social History:  Social History   Socioeconomic History  . Marital status: Married    Spouse name: Not on file  . Number of children: Not on file  . Years of education: Not on file  . Highest education level: Not on file  Social Needs  . Financial resource strain: Not on file  . Food insecurity - worry: Not on file  . Food insecurity - inability: Not on file  . Transportation needs - medical: Not on file  . Transportation needs - non-medical: Not on file  Occupational History  . Not on file  Tobacco Use  . Smoking status: Never Smoker  . Smokeless tobacco: Never Used  Substance and Sexual Activity  . Alcohol use: No  . Drug use: No  . Sexual activity: Yes    Birth control/protection: None  Other Topics Concern  . Not on file  Social History Narrative  . Not on file    Allergies:  No Known Allergies  Medications: Prior to Admission medications   Medication Sig Start Date End Date Taking? Authorizing Provider  Prenatal Vit-Fe Fumarate-FA (PRENATAL MULTIVITAMIN) TABS tablet Take 1 tablet by mouth daily.    Yes [provider]    Physical Exam Vitals:  Vitals:   08/16/17 1358  BP: 104/68  Pulse: 92    General: NAD HEENT: normocephalic, anicteric Pulmonary: No increased work of breathing Abdomen: NABS, soft, non-tender, non-distended.  Umbilicus without lesions.  No hepatomegaly, splenomegaly or masses  palpable. No evidence of hernia. Incision D/C/I Genitourinary:  External: Normal external female genitalia.  Normal urethral meatus, normal  Bartholin's and Skene's glands.    Vagina: Normal vaginal mucosa, no evidence of prolapse.    Cervix: Grossly normal in appearance, no bleeding  Uterus: Non-enlarged, mobile, normal contour.  No CMT  Adnexa: ovaries non-enlarged, no adnexal masses  Rectal:  deferred Extremities: no edema, erythema, or tenderness Neurologic: Grossly intact Psychiatric: mood appropriate, affect full  Assessment: 32 y.o. Z6X0960 presenting for 6 week postpartum visit  Plan: Problem List Items Addressed This Visit    None    Visit Diagnoses    6 weeks postpartum follow-up    -  Primary       1) Contraception - condoms  2)  Pap - ASCCP guidelines and rational discussed.  Patient opts for 3 screening interval  3) Patient underwent screening for postpartum depression with no concerns noted.  4) Reglan for milk supply  5) DeQuervaine's tenosynovitis - discussed prn NSAID use  6( Follow up 1 year for routine annual exam
